# Patient Record
Sex: Male | Born: 1975 | Race: White | Hispanic: No | Marital: Single | Smoking: Never smoker
Health system: Southern US, Community
[De-identification: ages and names within clinical notes are randomized; demographics above are authoritative.]

## PROBLEM LIST (undated history)

## (undated) DIAGNOSIS — E785 Hyperlipidemia, unspecified: Secondary | ICD-10-CM

## (undated) DIAGNOSIS — I10 Essential (primary) hypertension: Secondary | ICD-10-CM

## (undated) DIAGNOSIS — E119 Type 2 diabetes mellitus without complications: Secondary | ICD-10-CM

## (undated) DIAGNOSIS — I5020 Unspecified systolic (congestive) heart failure: Secondary | ICD-10-CM

## (undated) DIAGNOSIS — A4902 Methicillin resistant Staphylococcus aureus infection, unspecified site: Secondary | ICD-10-CM

## (undated) DIAGNOSIS — J45909 Unspecified asthma, uncomplicated: Secondary | ICD-10-CM

## (undated) DIAGNOSIS — I2510 Atherosclerotic heart disease of native coronary artery without angina pectoris: Secondary | ICD-10-CM

## (undated) HISTORY — PX: APPENDECTOMY: SHX54

## (undated) HISTORY — PX: ABSCESS DRAINAGE: SHX1119

---

## 2001-06-25 ENCOUNTER — Encounter: Payer: Self-pay | Admitting: Emergency Medicine

## 2001-06-25 ENCOUNTER — Emergency Department (HOSPITAL_COMMUNITY): Admission: EM | Admit: 2001-06-25 | Discharge: 2001-06-25 | Payer: Self-pay | Admitting: Emergency Medicine

## 2003-02-07 ENCOUNTER — Encounter: Admission: RE | Admit: 2003-02-07 | Discharge: 2003-05-08 | Payer: Self-pay | Admitting: Family Medicine

## 2012-11-17 DIAGNOSIS — A4902 Methicillin resistant Staphylococcus aureus infection, unspecified site: Secondary | ICD-10-CM

## 2012-11-17 HISTORY — DX: Methicillin resistant Staphylococcus aureus infection, unspecified site: A49.02

## 2018-02-02 ENCOUNTER — Other Ambulatory Visit: Payer: Self-pay

## 2018-02-02 ENCOUNTER — Emergency Department (HOSPITAL_COMMUNITY): Payer: No Typology Code available for payment source

## 2018-02-02 ENCOUNTER — Inpatient Hospital Stay (HOSPITAL_COMMUNITY)
Admission: EM | Admit: 2018-02-02 | Discharge: 2018-02-12 | DRG: 286 | Disposition: A | Discharge status: Home / Self Care | Payer: No Typology Code available for payment source | Attending: Cardiology | Admitting: Cardiology

## 2018-02-02 ENCOUNTER — Encounter (HOSPITAL_COMMUNITY): Payer: Self-pay

## 2018-02-02 DIAGNOSIS — E876 Hypokalemia: Secondary | ICD-10-CM | POA: Diagnosis present

## 2018-02-02 DIAGNOSIS — E1165 Type 2 diabetes mellitus with hyperglycemia: Secondary | ICD-10-CM | POA: Diagnosis present

## 2018-02-02 DIAGNOSIS — I5043 Acute on chronic combined systolic (congestive) and diastolic (congestive) heart failure: Secondary | ICD-10-CM

## 2018-02-02 DIAGNOSIS — Z7951 Long term (current) use of inhaled steroids: Secondary | ICD-10-CM | POA: Diagnosis not present

## 2018-02-02 DIAGNOSIS — I10 Essential (primary) hypertension: Secondary | ICD-10-CM | POA: Diagnosis not present

## 2018-02-02 DIAGNOSIS — I509 Heart failure, unspecified: Secondary | ICD-10-CM | POA: Diagnosis not present

## 2018-02-02 DIAGNOSIS — R778 Other specified abnormalities of plasma proteins: Secondary | ICD-10-CM

## 2018-02-02 DIAGNOSIS — E785 Hyperlipidemia, unspecified: Secondary | ICD-10-CM | POA: Diagnosis present

## 2018-02-02 DIAGNOSIS — R791 Abnormal coagulation profile: Secondary | ICD-10-CM | POA: Diagnosis present

## 2018-02-02 DIAGNOSIS — I472 Ventricular tachycardia: Secondary | ICD-10-CM | POA: Diagnosis not present

## 2018-02-02 DIAGNOSIS — G4733 Obstructive sleep apnea (adult) (pediatric): Secondary | ICD-10-CM | POA: Diagnosis present

## 2018-02-02 DIAGNOSIS — I2510 Atherosclerotic heart disease of native coronary artery without angina pectoris: Secondary | ICD-10-CM | POA: Diagnosis present

## 2018-02-02 DIAGNOSIS — R7989 Other specified abnormal findings of blood chemistry: Secondary | ICD-10-CM

## 2018-02-02 DIAGNOSIS — R570 Cardiogenic shock: Secondary | ICD-10-CM | POA: Diagnosis present

## 2018-02-02 DIAGNOSIS — E7800 Pure hypercholesterolemia, unspecified: Secondary | ICD-10-CM | POA: Diagnosis present

## 2018-02-02 DIAGNOSIS — Z8249 Family history of ischemic heart disease and other diseases of the circulatory system: Secondary | ICD-10-CM

## 2018-02-02 DIAGNOSIS — Q231 Congenital insufficiency of aortic valve: Secondary | ICD-10-CM

## 2018-02-02 DIAGNOSIS — I429 Cardiomyopathy, unspecified: Secondary | ICD-10-CM | POA: Diagnosis present

## 2018-02-02 DIAGNOSIS — I493 Ventricular premature depolarization: Secondary | ICD-10-CM | POA: Diagnosis present

## 2018-02-02 DIAGNOSIS — I471 Supraventricular tachycardia: Secondary | ICD-10-CM | POA: Diagnosis present

## 2018-02-02 DIAGNOSIS — Z79899 Other long term (current) drug therapy: Secondary | ICD-10-CM | POA: Diagnosis not present

## 2018-02-02 DIAGNOSIS — E119 Type 2 diabetes mellitus without complications: Secondary | ICD-10-CM

## 2018-02-02 DIAGNOSIS — I110 Hypertensive heart disease with heart failure: Secondary | ICD-10-CM | POA: Diagnosis not present

## 2018-02-02 DIAGNOSIS — I351 Nonrheumatic aortic (valve) insufficiency: Secondary | ICD-10-CM | POA: Diagnosis not present

## 2018-02-02 DIAGNOSIS — I361 Nonrheumatic tricuspid (valve) insufficiency: Secondary | ICD-10-CM | POA: Diagnosis not present

## 2018-02-02 DIAGNOSIS — J45909 Unspecified asthma, uncomplicated: Secondary | ICD-10-CM | POA: Diagnosis present

## 2018-02-02 DIAGNOSIS — R748 Abnormal levels of other serum enzymes: Secondary | ICD-10-CM | POA: Diagnosis present

## 2018-02-02 DIAGNOSIS — I248 Other forms of acute ischemic heart disease: Secondary | ICD-10-CM | POA: Diagnosis present

## 2018-02-02 DIAGNOSIS — Z7984 Long term (current) use of oral hypoglycemic drugs: Secondary | ICD-10-CM | POA: Diagnosis not present

## 2018-02-02 DIAGNOSIS — I5023 Acute on chronic systolic (congestive) heart failure: Secondary | ICD-10-CM | POA: Diagnosis not present

## 2018-02-02 HISTORY — DX: Type 2 diabetes mellitus without complications: E11.9

## 2018-02-02 HISTORY — DX: Hyperlipidemia, unspecified: E78.5

## 2018-02-02 HISTORY — DX: Essential (primary) hypertension: I10

## 2018-02-02 HISTORY — DX: Atherosclerotic heart disease of native coronary artery without angina pectoris: I25.10

## 2018-02-02 HISTORY — DX: Methicillin resistant Staphylococcus aureus infection, unspecified site: A49.02

## 2018-02-02 HISTORY — DX: Unspecified systolic (congestive) heart failure: I50.20

## 2018-02-02 HISTORY — DX: Unspecified asthma, uncomplicated: J45.909

## 2018-02-02 LAB — I-STAT TROPONIN, ED: TROPONIN I, POC: 0.16 ng/mL — AB (ref 0.00–0.08)

## 2018-02-02 LAB — BASIC METABOLIC PANEL
Anion gap: 11 (ref 5–15)
BUN: 31 mg/dL — AB (ref 6–20)
CALCIUM: 9.3 mg/dL (ref 8.9–10.3)
CO2: 21 mmol/L — ABNORMAL LOW (ref 22–32)
CREATININE: 1.12 mg/dL (ref 0.61–1.24)
Chloride: 107 mmol/L (ref 101–111)
GFR calc non Af Amer: >60 mL/min (ref >60)
Glucose, Bld: 110 mg/dL — ABNORMAL HIGH (ref 65–99)
Potassium: 3.5 mmol/L (ref 3.5–5.1)
SODIUM: 139 mmol/L (ref 135–145)

## 2018-02-02 LAB — HEPATIC FUNCTION PANEL
ALT: 16 U/L — AB (ref 17–63)
AST: 33 U/L (ref 15–41)
Albumin: 3.6 g/dL (ref 3.5–5.0)
Alkaline Phosphatase: 52 U/L (ref 38–126)
BILIRUBIN DIRECT: 0.4 mg/dL (ref 0.1–0.5)
BILIRUBIN INDIRECT: 1.1 mg/dL — AB (ref 0.3–0.9)
BILIRUBIN TOTAL: 1.5 mg/dL — AB (ref 0.3–1.2)
Total Protein: 6.7 g/dL (ref 6.5–8.1)

## 2018-02-02 LAB — CBC
HCT: 47.1 % (ref 39.0–52.0)
Hemoglobin: 15.4 g/dL (ref 13.0–17.0)
MCH: 29.1 pg (ref 26.0–34.0)
MCHC: 32.7 g/dL (ref 30.0–36.0)
MCV: 89 fL (ref 78.0–100.0)
PLATELETS: 259 10*3/uL (ref 150–400)
RBC: 5.29 MIL/uL (ref 4.22–5.81)
RDW: 14.8 % (ref 11.5–15.5)
WBC: 14 10*3/uL — AB (ref 4.0–10.5)

## 2018-02-02 LAB — BRAIN NATRIURETIC PEPTIDE: B NATRIURETIC PEPTIDE 5: 1724.6 pg/mL — AB (ref 0.0–100.0)

## 2018-02-02 LAB — RAPID URINE DRUG SCREEN, HOSP PERFORMED
Amphetamines: NOT DETECTED
BARBITURATES: NOT DETECTED
BENZODIAZEPINES: NOT DETECTED
COCAINE: NOT DETECTED
Opiates: NOT DETECTED
TETRAHYDROCANNABINOL: NOT DETECTED

## 2018-02-02 LAB — GLUCOSE, CAPILLARY: Glucose-Capillary: 155 mg/dL — ABNORMAL HIGH (ref 65–99)

## 2018-02-02 LAB — TSH: TSH: 3.207 u[IU]/mL (ref 0.350–4.500)

## 2018-02-02 LAB — D-DIMER, QUANTITATIVE: D-Dimer, Quant: 1.54 ug/mL-FEU — ABNORMAL HIGH (ref 0.00–0.50)

## 2018-02-02 LAB — TROPONIN I: Troponin I: 0.2 ng/mL (ref <0.03)

## 2018-02-02 MED ORDER — FUROSEMIDE 10 MG/ML IJ SOLN
20.0000 mg | Freq: Once | INTRAMUSCULAR | Status: AC
  Administered 2018-02-02: 20 mg via INTRAVENOUS
  Filled 2018-02-02: qty 2

## 2018-02-02 MED ORDER — SODIUM CHLORIDE 0.9% FLUSH
3.0000 mL | Freq: Two times a day (BID) | INTRAVENOUS | Status: DC
  Administered 2018-02-02 – 2018-02-11 (×10): 3 mL via INTRAVENOUS

## 2018-02-02 MED ORDER — ALBUTEROL SULFATE (2.5 MG/3ML) 0.083% IN NEBU: 3.0000 mL | INHALATION_SOLUTION | Freq: Four times a day (QID) | RESPIRATORY_TRACT | Status: DC | PRN

## 2018-02-02 MED ORDER — ATORVASTATIN CALCIUM 40 MG PO TABS
40.0000 mg | ORAL_TABLET | Freq: Every day | ORAL | Status: DC
  Administered 2018-02-02 – 2018-02-08 (×7): 40 mg via ORAL
  Filled 2018-02-02 (×8): qty 1

## 2018-02-02 MED ORDER — OMEGA-3-ACID ETHYL ESTERS 1 G PO CAPS
1.0000 g | ORAL_CAPSULE | Freq: Two times a day (BID) | ORAL | Status: DC
  Administered 2018-02-02 – 2018-02-12 (×19): 1 g via ORAL
  Filled 2018-02-02 (×19): qty 1

## 2018-02-02 MED ORDER — IOPAMIDOL (ISOVUE-370) INJECTION 76%
INTRAVENOUS | Status: AC
  Administered 2018-02-02: 100 mL
  Filled 2018-02-02: qty 100

## 2018-02-02 MED ORDER — ACETAMINOPHEN 325 MG PO TABS: 650.0000 mg | ORAL_TABLET | ORAL | Status: DC | PRN

## 2018-02-02 MED ORDER — SERTRALINE HCL 100 MG PO TABS
100.0000 mg | ORAL_TABLET | Freq: Every day | ORAL | Status: DC
  Administered 2018-02-03 – 2018-02-12 (×10): 100 mg via ORAL
  Filled 2018-02-02 (×10): qty 1

## 2018-02-02 MED ORDER — ONDANSETRON HCL 4 MG/2ML IJ SOLN: 4.0000 mg | Freq: Four times a day (QID) | INTRAMUSCULAR | Status: DC | PRN

## 2018-02-02 MED ORDER — INSULIN ASPART 100 UNIT/ML ~~LOC~~ SOLN: 0.0000 [IU] | Freq: Every day | SUBCUTANEOUS | Status: DC

## 2018-02-02 MED ORDER — SODIUM CHLORIDE 0.9 % IV SOLN: 250.0000 mL | INTRAVENOUS | Status: DC | PRN

## 2018-02-02 MED ORDER — ASPIRIN EC 81 MG PO TBEC: 81.0000 mg | DELAYED_RELEASE_TABLET | Freq: Every day | ORAL | Status: DC

## 2018-02-02 MED ORDER — FLUTICASONE PROPIONATE 50 MCG/ACT NA SUSP
1.0000 | Freq: Every day | NASAL | Status: DC
  Administered 2018-02-05 – 2018-02-12 (×8): 1 via NASAL
  Filled 2018-02-02 (×2): qty 16

## 2018-02-02 MED ORDER — MOMETASONE FURO-FORMOTEROL FUM 200-5 MCG/ACT IN AERO
2.0000 | INHALATION_SPRAY | Freq: Two times a day (BID) | RESPIRATORY_TRACT | Status: DC
  Administered 2018-02-03 – 2018-02-12 (×17): 2 via RESPIRATORY_TRACT
  Filled 2018-02-02: qty 8.8

## 2018-02-02 MED ORDER — SODIUM CHLORIDE 0.9% FLUSH: 3.0000 mL | INTRAVENOUS | Status: DC | PRN

## 2018-02-02 MED ORDER — LOSARTAN POTASSIUM 50 MG PO TABS
100.0000 mg | ORAL_TABLET | Freq: Every day | ORAL | Status: DC
  Administered 2018-02-03: 100 mg via ORAL
  Filled 2018-02-02: qty 2

## 2018-02-02 MED ORDER — CARVEDILOL 3.125 MG PO TABS
3.1250 mg | ORAL_TABLET | Freq: Two times a day (BID) | ORAL | Status: DC
  Administered 2018-02-03 – 2018-02-04 (×3): 3.125 mg via ORAL
  Filled 2018-02-02 (×4): qty 1

## 2018-02-02 MED ORDER — ASPIRIN 81 MG PO CHEW
81.0000 mg | CHEWABLE_TABLET | Freq: Every day | ORAL | Status: DC
  Administered 2018-02-02 – 2018-02-03 (×2): 81 mg via ORAL
  Filled 2018-02-02 (×3): qty 1

## 2018-02-02 MED ORDER — FUROSEMIDE 10 MG/ML IJ SOLN
40.0000 mg | Freq: Two times a day (BID) | INTRAMUSCULAR | Status: DC
  Administered 2018-02-02 – 2018-02-04 (×4): 40 mg via INTRAVENOUS
  Filled 2018-02-02 (×3): qty 4

## 2018-02-02 MED ORDER — INSULIN ASPART 100 UNIT/ML ~~LOC~~ SOLN
0.0000 [IU] | Freq: Three times a day (TID) | SUBCUTANEOUS | Status: DC
  Administered 2018-02-03: 3 [IU] via SUBCUTANEOUS
  Administered 2018-02-04: 5 [IU] via SUBCUTANEOUS
  Administered 2018-02-05: 2 [IU] via SUBCUTANEOUS

## 2018-02-02 MED ORDER — ENOXAPARIN SODIUM 40 MG/0.4ML ~~LOC~~ SOLN
40.0000 mg | SUBCUTANEOUS | Status: DC
  Administered 2018-02-02 – 2018-02-04 (×3): 40 mg via SUBCUTANEOUS
  Filled 2018-02-02 (×3): qty 0.4

## 2018-02-02 NOTE — Plan of Care (Signed)
  Activity: Risk for activity intolerance will decrease 02/02/2018 2242 - Completed/Met by Theora Gianotti, RN  Pt up ad lib. Ambulates independently with steady gait.

## 2018-02-02 NOTE — ED Provider Notes (Signed)
[facility] EMERGENCY DEPARTMENT Provider Note   CSN: [id number] Arrival date & time: 02/02/18  1225     History   Chief Complaint Chief Complaint  Patient presents with  . Shortness of Breath  . Leg Swelling    HPI Curtis [name] is a 42 y.o. male.  Patient c/o increased bil leg swelling, and sob in the past 2 weeks. Symptoms gradual onset, persistent, moderate,  slowly worsening. Denies hx same. States compliant w his normal meds, no recent change in meds or doses. Is urinating normal amount. +orthopnea. Denies current or recent chest pain or discomfort. No recent febrile illness or uri symptoms. No hx chf. No hx renal or liver disease. 15 lb weight increase in past few weeks.    The history is provided by the patient.  Shortness of Breath  Associated symptoms include leg swelling. Pertinent negatives include no fever, no headaches, no sore throat, no neck pain, no chest pain, no vomiting, no abdominal pain and no rash.    Past Medical History:  Diagnosis Date  . Diabetes mellitus without complication (HCC)   . Hypertension     There are no active problems to display for this patient.   Past Surgical History:  Procedure Laterality Date  . APPENDECTOMY         Home Medications    Prior to Admission medications   Not on File    Family History No family history on file.  Social History Social History   Tobacco Use  . Smoking status: Never Smoker  . Smokeless tobacco: Never Used  Substance Use Topics  . Alcohol use: No    Frequency: Never  . Drug use: No     Allergies   Patient has no known allergies.   Review of Systems Review of Systems  Constitutional: Negative for fever.  HENT: Negative for sore throat.   Eyes: Negative for redness.  Respiratory: Positive for shortness of breath.   Cardiovascular: Positive for leg swelling. Negative for chest pain.  Gastrointestinal: Negative for abdominal pain and vomiting.    Genitourinary: Negative for flank pain.  Musculoskeletal: Negative for neck pain.  Skin: Negative for rash.  Neurological: Negative for headaches.  Hematological: Does not bruise/bleed easily.  Psychiatric/Behavioral: Negative for confusion.     Physical Exam Updated Vital Signs BP (!) 128/94 (BP Location: Right Arm)   Pulse (!) 112   Temp 97.8 F (36.6 C) (Oral)   Resp 16   SpO2 98%   Physical Exam  Constitutional: He appears well-developed and well-nourished. No distress.  HENT:  Mouth/Throat: Oropharynx is clear and moist.  Eyes: Conjunctivae are normal.  Neck: Neck supple. No tracheal deviation present. No thyromegaly present.  Cardiovascular: Regular rhythm, normal heart sounds and intact distal pulses.  Tachycardic.   Pulmonary/Chest: Effort normal. No accessory muscle usage. No respiratory distress.  Rales bases  Abdominal: Soft. Bowel sounds are normal. He exhibits no distension. There is no tenderness.  Genitourinary:  Genitourinary Comments: No cva tenderness  Musculoskeletal: He exhibits edema.  Edema to thighs bil, symmetric.   Neurological: He is alert.  Skin: Skin is warm and dry. He is not diaphoretic.  Psychiatric: He has a normal mood and affect.  Nursing note and vitals reviewed.    ED Treatments / Results  Labs (all labs ordered are listed, but only abnormal results are displayed) Results for orders placed or performed during the hospital encounter of 02/02/18  Basic metabolic panel  Result Value Ref Range  Sodium 139 135 - 145 mmol/L   Potassium 3.5 3.5 - 5.1 mmol/L   Chloride 107 101 - 111 mmol/L   CO2 21 (L) 22 - 32 mmol/L   Glucose, Bld 110 (H) 65 - 99 mg/dL   BUN 31 (H) 6 - 20 mg/dL   Creatinine, Ser 5.45 0.61 - 1.24 mg/dL   Calcium 9.3 8.9 - 62.5 mg/dL   GFR calc non Af Amer >60 >60 mL/min   GFR calc Af Amer >60 >60 mL/min   Anion gap 11 5 - 15  CBC  Result Value Ref Range   WBC 14.0 (H) 4.0 - 10.5 K/uL   RBC 5.29 4.22 - 5.81  MIL/uL   Hemoglobin 15.4 13.0 - 17.0 g/dL   HCT 63.8 93.7 - 34.2 %   MCV 89.0 78.0 - 100.0 fL   MCH 29.1 26.0 - 34.0 pg   MCHC 32.7 30.0 - 36.0 g/dL   RDW 87.6 81.1 - 57.2 %   Platelets 259 150 - 400 K/uL  Brain natriuretic peptide  Result Value Ref Range   B Natriuretic Peptide 1,724.6 (H) 0.0 - 100.0 pg/mL  Hepatic function panel  Result Value Ref Range   Total Protein 6.7 6.5 - 8.1 g/dL   Albumin 3.6 3.5 - 5.0 g/dL   AST 33 15 - 41 U/L   ALT 16 (L) 17 - 63 U/L   Alkaline Phosphatase 52 38 - 126 U/L   Total Bilirubin 1.5 (H) 0.3 - 1.2 mg/dL   Bilirubin, Direct 0.4 0.1 - 0.5 mg/dL   Indirect Bilirubin 1.1 (H) 0.3 - 0.9 mg/dL  D-dimer, quantitative (not at [facility])  Result Value Ref Range   D-Dimer, Quant 1.54 (H) 0.00 - 0.50 ug/mL-FEU  I-stat troponin, ED  Result Value Ref Range   Troponin i, poc 0.16 (HH) 0.00 - 0.08 ng/mL   Comment NOTIFIED PHYSICIAN    Comment 3           Dg Chest 2 View  Result Date: 02/02/2018 CLINICAL DATA:  Shortness of breath, lower extremity and abdominal swelling, and 15 pound weight gain over the past 3 days. History of diabetes and asthma but no history of CHF. EXAM: CHEST - 2 VIEW COMPARISON:  None in PACs FINDINGS: The lungs are adequately inflated. There are bilateral pleural effusions greatest on the right. The lung markings are coarse in the left retrocardiac region. The cardiac silhouette is enlarged. The central pulmonary vascularity is engorged and there is mild cephalization. The mediastinum is normal in width. There is multilevel degenerative disc disease of the thoracic spine. IMPRESSION: CHF with mild interstitial edema and bilateral pleural effusions. Electronically Signed   By: David  [place] M.D.   On: 02/02/2018 13:07    EKG  EKG Interpretation  Date/Time:  Tuesday February 02 2018 12:40:30 EDT Ventricular Rate:  114 PR Interval:  168 QRS Duration: 118 QT Interval:  356 QTC Calculation: 490 R Axis:   66 Text Interpretation:  Sinus  rhythm Premature ventricular complexes Left ventricular hypertrophy Non-specific ST-t changes No previous tracing Confirmed by Cathren Laine ([id number]) on 02/02/2018 1:26:04 PM       Radiology Dg Chest 2 View  Result Date: 02/02/2018 CLINICAL DATA:  Shortness of breath, lower extremity and abdominal swelling, and 15 pound weight gain over the past 3 days. History of diabetes and asthma but no history of CHF. EXAM: CHEST - 2 VIEW COMPARISON:  None in PACs FINDINGS: The lungs are adequately inflated. There are bilateral pleural effusions  greatest on the right. The lung markings are coarse in the left retrocardiac region. The cardiac silhouette is enlarged. The central pulmonary vascularity is engorged and there is mild cephalization. The mediastinum is normal in width. There is multilevel degenerative disc disease of the thoracic spine. IMPRESSION: CHF with mild interstitial edema and bilateral pleural effusions. Electronically Signed   By: David  [place] M.D.   On: 02/02/2018 13:07    Procedures Procedures (including critical care time)  Medications Ordered in ED Medications - No data to display   Initial Impression / Assessment and Plan / ED Course  I have reviewed the triage vital signs and the nursing notes.  Pertinent labs & imaging results that were available during my care of the patient were reviewed by me and considered in my medical decision making (see chart for details).  Iv ns. Labs. Cxr. Ecg.  Reviewed nursing notes and prior charts for additional history.   Labs reviewed, troponin is high.   CTa pending.  Feel patient will require echo and additional workup.  Hospitalists consulted for admission.     Final Clinical Impressions(s) / ED Diagnoses   Final diagnoses:  None    ED Discharge Orders    None       Cathren Laine, MD 02/02/18 1445

## 2018-02-02 NOTE — ED Triage Notes (Signed)
Pt reports he was sent from pcp d/t 15lb weight gain over last 3 days with sob, abdominal swelling, BLE swelling. Denies CP. Pt denies hx of chf.

## 2018-02-02 NOTE — Consult Note (Signed)
CONSULTATION NOTE   Patient Name: Curtis [name] Date of Encounter: 02/02/2018 Cardiologist: NEW patient  Chief Complaint   Shortness of breath  Patient Profile   42 yo male with DM2, HTN, dyslipidemia, presents with SOB, LE edema, orthopnea and weight gain.   HPI   Curtis [name] is a 42 y.o. male who is being seen today for the evaluation of new onset CHF at the request of Dr. Lorin Mercy. This is a 42 yo male patient with a history of DM2, HTN, dyslipidemia and family history of CAD - parents are patients of Dr. Martinique. He reports running out of his hypertension meds in February and has had worsening edema, DOE, orthopnea and 15 lb weight gain over the past several weeks, eventually, causing him to present to the ER. Labs indicate a significantly elevated BNP of 1724 with mildly elevated troponin of 0.16. D-dimer was positive at 1.54 - a CT angiogram was performed which was negative for pulmonary embolus, but did show cardiomegaly with coronary artery calcification (primarily in the LAD), pericardial effusion and bilateral pleural effusions. EKG (personally reviewed) showed sinus tachycardia, PVC's and IVCD with LVH by voltage. He denies anginal symptoms.  PMHx   Past Medical History:  Diagnosis Date  . Asthma   . Diabetes mellitus without complication ([place])   . Hyperlipidemia   . Hypertension     Past Surgical History:  Procedure Laterality Date  . ABSCESS DRAINAGE     MRSA inside nares  . APPENDECTOMY      FAMHx   Family History  Problem Relation Age of Onset  . Mitral valve prolapse Mother   . Supraventricular tachycardia Mother   . CAD Father 45       CABG, stent  . Hypertension Father   . Heart failure Maternal Grandfather   . Congestive Heart Failure Paternal Uncle        has pacer, defibrillator  . CAD Paternal Grandfather   . CAD Maternal Grandmother   . CAD Maternal Uncle     SOCHx    reports that  has never smoked. he has never used smokeless tobacco.  He reports that he does not drink alcohol or use drugs.  Outpatient Medications   No current facility-administered medications on file prior to encounter.    Current Outpatient Medications on File Prior to Encounter  Medication Sig Dispense Refill  . albuterol (PROVENTIL HFA;VENTOLIN HFA) 108 (90 Base) MCG/ACT inhaler Inhale 2 puffs into the lungs every 6 (six) hours as needed for wheezing or shortness of breath.    . budesonide-formoterol (SYMBICORT) 160-4.5 MCG/ACT inhaler Inhale 2 puffs into the lungs 2 (two) times daily.    . Cholecalciferol (VITAMIN D3) 5000 units CAPS Take 5,000 Units by mouth daily.    . fluticasone (FLONASE) 50 MCG/ACT nasal spray Place 1 spray into both nostrils daily.    Marland Kitchen glimepiride (AMARYL) 2 MG tablet Take 2 mg by mouth daily with breakfast.    . hydrochlorothiazide (HYDRODIURIL) 25 MG tablet Take 25 mg by mouth daily.    Marland Kitchen losartan (COZAAR) 100 MG tablet Take 100 mg by mouth daily.    Marland Kitchen METFORMIN HCL ER PO Take 1,000 mg by mouth 2 (two) times daily.     . Multiple Vitamin (MULTIVITAMIN) tablet Take 1 tablet by mouth daily.    Marland Kitchen omega-3 acid ethyl esters (LOVAZA) 1 g capsule Take 1 g by mouth 2 (two) times daily.    . sertraline (ZOLOFT) 100 MG tablet Take 100 mg by  mouth daily.    . simvastatin (ZOCOR) 20 MG tablet Take 20 mg by mouth daily at 6 PM.      Inpatient Medications    Scheduled Meds: . atorvastatin  40 mg Oral q1800  . furosemide  20 mg Intravenous Once    Continuous Infusions:   PRN Meds:    ALLERGIES   No Known Allergies  ROS   Pertinent items noted in HPI and remainder of comprehensive ROS otherwise negative.  Vitals   Vitals:   02/02/18 1630 02/02/18 1700 02/02/18 1715 02/02/18 1745  BP: (!) 134/101 (!) 127/102 (!) 135/100 140/90  Pulse: (!) 108 (!) 51 (!) 106 (!) 107  Resp: (!) 27 (!) 31 (!) 33 (!) 24  Temp:      TempSrc:      SpO2: 100% 98% 100% 99%   No intake or output data in the 24 hours ending 02/02/18  1800 There were no vitals filed for this visit.  Physical Exam   General appearance: alert, no distress and tall, multiple tattoos Neck: JVD - several cm above sternal notch, no carotid bruit and thyroid not enlarged, symmetric, no tenderness/mass/nodules Lungs: diminished breath sounds bilaterally and dullness to percussion RLL and RML Heart: regular tachycardia, occasional missed beats Abdomen: protuberant, soft, non-tender Extremities: edema 2+ bilateral LE pitting edema Pulses: 2+ and symmetric Skin: Skin color, texture, turgor normal. No rashes or lesions Neurologic: Grossly normal Psych: Pleasant, mildly anxious  Labs   Results for orders placed or performed during the hospital encounter of 02/02/18 (from the past 48 hour(s))  Basic metabolic panel     Status: Abnormal   Collection Time: 02/02/18 12:47 PM  Result Value Ref Range   Sodium 139 135 - 145 mmol/L   Potassium 3.5 3.5 - 5.1 mmol/L   Chloride 107 101 - 111 mmol/L   CO2 21 (L) 22 - 32 mmol/L   Glucose, Bld 110 (H) 65 - 99 mg/dL   BUN 31 (H) 6 - 20 mg/dL   Creatinine, Ser 1.12 0.61 - 1.24 mg/dL   Calcium 9.3 8.9 - 10.3 mg/dL   GFR calc non Af Amer >60 >60 mL/min   GFR calc Af Amer >60 >60 mL/min    Comment: (NOTE) The eGFR has been calculated using the CKD EPI equation. This calculation has not been validated in all clinical situations. eGFR's persistently <60 mL/min signify possible Chronic Kidney Disease.    Anion gap 11 5 - 15    Comment: Performed at [facility] [address] [place], [place] [address]  CBC     Status: Abnormal   Collection Time: 02/02/18 12:47 PM  Result Value Ref Range   WBC 14.0 (H) 4.0 - 10.5 K/uL   RBC 5.29 4.22 - 5.81 MIL/uL   Hemoglobin 15.4 13.0 - 17.0 g/dL   HCT 47.1 39.0 - 52.0 %   MCV 89.0 78.0 - 100.0 fL   MCH 29.1 26.0 - 34.0 pg   MCHC 32.7 30.0 - 36.0 g/dL   RDW 14.8 11.5 - 15.5 %   Platelets 259 150 - 400 K/uL    Comment: Performed at [facility], [place] [address] [place], [place] [address]  I-stat troponin, ED     Status: Abnormal   Collection Time: 02/02/18  1:06 PM  Result Value Ref Range   Troponin i, poc 0.16 (HH) 0.00 - 0.08 ng/mL   Comment NOTIFIED PHYSICIAN    Comment 3  Comment: Due to the release kinetics of cTnI, a negative result within the first hours of the onset of symptoms does not rule out myocardial infarction with certainty. If myocardial infarction is still suspected, repeat the test at appropriate intervals.   Brain natriuretic peptide     Status: Abnormal   Collection Time: 02/02/18  1:29 PM  Result Value Ref Range   B Natriuretic Peptide 1,724.6 (H) 0.0 - 100.0 pg/mL    Comment: Performed at [facility] [address], [place], [place] [address]  Hepatic function panel     Status: Abnormal   Collection Time: 02/02/18  1:39 PM  Result Value Ref Range   Total Protein 6.7 6.5 - 8.1 g/dL   Albumin 3.6 3.5 - 5.0 g/dL   AST 33 15 - 41 U/L   ALT 16 (L) 17 - 63 U/L   Alkaline Phosphatase 52 38 - 126 U/L   Total Bilirubin 1.5 (H) 0.3 - 1.2 mg/dL   Bilirubin, Direct 0.4 0.1 - 0.5 mg/dL   Indirect Bilirubin 1.1 (H) 0.3 - 0.9 mg/dL    Comment: Performed at [facility] [address] [place], [place] [address]  D-dimer, quantitative (not at [facility])     Status: Abnormal   Collection Time: 02/02/18  1:39 PM  Result Value Ref Range   D-Dimer, Quant 1.54 (H) 0.00 - 0.50 ug/mL-FEU    Comment: (NOTE) At the manufacturer cut-off of 0.50 ug/mL FEU, this assay has been documented to exclude PE with a sensitivity and negative predictive value of 97 to 99%.  At this time, this assay has not been approved by the FDA to exclude DVT/VTE. Results should be correlated with clinical presentation. Performed at [facility], [place] [address] [place], [place] [address]   TSH     Status: None   Collection Time: 02/02/18  1:42 PM  Result Value Ref Range   TSH 3.207 0.350 - 4.500 uIU/mL     Comment: Performed by a 3rd Generation assay with a functional sensitivity of <=0.01 uIU/mL. Performed at [facility], [place] [address], [place], [place] [address]     ECG   Sinus tachycardia with PVC's, LVH, IVCD - Personally Reviewed  Telemetry   Sinus tachy with PVC's - Personally Reviewed  Radiology   Dg Chest 2 View  Result Date: 02/02/2018 CLINICAL DATA:  Shortness of breath, lower extremity and abdominal swelling, and 15 pound weight gain over the past 3 days. History of diabetes and asthma but no history of CHF. EXAM: CHEST - 2 VIEW COMPARISON:  None in PACs FINDINGS: The lungs are adequately inflated. There are bilateral pleural effusions greatest on the right. The lung markings are coarse in the left retrocardiac region. The cardiac silhouette is enlarged. The central pulmonary vascularity is engorged and there is mild cephalization. The mediastinum is normal in width. There is multilevel degenerative disc disease of the thoracic spine. IMPRESSION: CHF with mild interstitial edema and bilateral pleural effusions. Electronically Signed   By: David  Martinique M.D.   On: 02/02/2018 13:07   Ct Angio Chest Pe W And/or Wo Contrast  Result Date: 02/02/2018 CLINICAL DATA:  Short of breath, lower extremity swelling for 5 days, positive D-dimer EXAM: CT ANGIOGRAPHY CHEST WITH CONTRAST TECHNIQUE: Multidetector CT imaging of the chest was performed using the standard protocol during bolus administration of intravenous contrast. Multiplanar CT image reconstructions and MIPs were obtained to evaluate the vascular anatomy. CONTRAST:  110m ISOVUE-370 IOPAMIDOL (  ISOVUE-370) INJECTION 76% COMPARISON:  Chest x-ray of 02/02/2018 FINDINGS: Cardiovascular: There is moderate cardiomegaly present there may be a small pericardial effusion present. There is good opacification of the pulmonary arteries. There is no evidence of acute pulmonary embolism. Diffuse coronary artery calcifications are noted  primarily in the left anterior descending distribution. The mid ascending thoracic aorta measures 39 mm in diameter. Mediastinum/Nodes: No mediastinal or hilar adenopathy is noted. The thyroid gland is unremarkable although not well visualized. Esophagus is unremarkable. Lungs/Pleura: On lung window images, there are bilateral pleural effusions right larger than left with resultant bibasilar atelectasis posteriorly. Pneumonia would be difficult to exclude particularly involving the right lower lobe although this appearance most likely reflects atelectasis. No suspicious lung nodule is seen. Upper Abdomen: Images through the upper abdomen show no significant abnormality. There is some reflux of contrast into the hepatic veins. Musculoskeletal: The thoracic vertebrae are in normal alignment with moderate degenerative spurring throughout the thoracic spine. No compression deformity is seen. Review of the MIP images confirms the above findings. IMPRESSION: 1. No evidence of acute pulmonary embolism. 2. Cardiomegaly, bilateral pleural effusions, right greater than left, findings most consistent with CHF. 3. Small pericardial effusion. 4. Coronary artery calcifications primarily within the left anterior descending artery. Electronically Signed   By: Ivar Drape M.D.   On: 02/02/2018 15:45    Cardiac Studies   Echo pending  Impression   1. Principal Problem: 2.   CHF exacerbation ([place]) 3. Active Problems: 4.   Asthma in adult, unspecified asthma severity, uncomplicated 5.   Essential hypertension 6.   Diabetes mellitus type 2, controlled ([place]) 7.   Hyperlipidemia 8.   Recommendation   1. Mr. Mclaren has acute CHF which I suspect may be ischemic. Initial troponin is elevated, would trend overnight. He denies chest pain. He has multiple cardiac risk factors and a strong family history of CAD. Plan for echo in the am tomorrow - if there is significant systolic dysfunction, will likely recommend a L/RHC to  further evaluate, especially given coronary artery calcification. For now, agree with diuresis.  Thanks for the consulting [place]. Will follow closely with you.  Time Spent Directly with Patient:  I have spent a total of 45 minutes with the patient reviewing hospital notes, telemetry, EKGs, labs and examining the patient as well as establishing an assessment and plan that was discussed personally with the patient. > 50% of time was spent in direct patient care.  Length of Stay:  LOS: 0 days   Pixie Casino, MD, [facility], [place] Director of the Advanced Lipid Disorders &  Cardiovascular Risk Reduction Clinic Diplomate of the American Board of Clinical Lipidology Attending Cardiologist  Direct Dial: [phone]  Fax: [phone]  Website:  [email or web address] 02/02/2018, 6:00 PM

## 2018-02-02 NOTE — ED Notes (Signed)
Heart healthy dinner tray ordered 

## 2018-02-02 NOTE — ED Notes (Signed)
Cardiology at bedside for evaluation.

## 2018-02-02 NOTE — ED Notes (Signed)
Gave pt [place] sandwich, applesauce and sprite, per Ashley-RN.

## 2018-02-02 NOTE — ED Notes (Signed)
Pt ambulated with steady gait to the bathroom.

## 2018-02-02 NOTE — H&P (Signed)
History and Physical    Curtis [name] RUE:[id number] DOB: August 17, 1976 DOA: 02/02/2018  PCP:   Gweneth Dimitri  Consultants:  None Patient coming from: Home - lives alone; NOK: parents, [phone], [phone], Brett Canales and Aggie  Chief Complaint: SOB, leg swelling  HPI: Curtis [name] is a 42 y.o. male with medical history significant of DM and HTN presenting with edema and SOB.  He had been taking medications for BP.  He ran out (Losartan/HCTZ) at the end of February.  He thought he felt bad because of that.  .  Last Friday, he developed LE edema.  It became really uncomfortable.  By Friday evening, he was having trouble walking.  He stayed out of work Saturday and spent the weekend with his parents.  He called his PCP office yesterday and they scheduled him to be seen today.  She sent him here to the ER.  He had been having some breathing issues since maybe mid-February.  His PCP added a controller medication and the "asthma issues" - SOB and cough, especially in the mornings - improved other than "just a bit of hoarseness" since. He started with SOB over the weekend, no CP.  It would knock him down, take him a second to regain his breath.  SOB was with exertion.  He usually uses 2 pillows but has needed at least 3 to prop him self up, + orthopnea.  +PND since Saturday.  He denies issues like viral illness.  He has gained about 15 pounds in the last 1-2 weeks.  His only illicit substances were weed edibles, in Dec or November.  He has multiple tattoos, all obtained from clean facilties.  He has been tested for hepatitis and was negative in the past, had hepatits vaccines 2 years ago.  He has not had a (male) sexual partner in about 2 years.    ED Course:   Sent from PCP for 1-2 weeks orthopnea, LE edema, and DOE.  No h/o CHF.  Has vascular congestion with edema on CXR.  Elevated BNP and troponin, no chest pain.  CHF exacerbation, needs echo and diuresis.  Review of Systems: As per HPI; otherwise  review of systems reviewed and negative.   Ambulatory Status:   Ambulates without assistance  Past Medical History:  Diagnosis Date  . Asthma   . Diabetes mellitus without complication (HCC)   . Hyperlipidemia   . Hypertension     Past Surgical History:  Procedure Laterality Date  . ABSCESS DRAINAGE     MRSA inside nares  . APPENDECTOMY      Social History   Socioeconomic History  . Marital status: Single    Spouse name: Not on file  . Number of children: Not on file  . Years of education: Not on file  . Highest education level: Not on file  Social Needs  . Financial resource strain: Not on file  . Food insecurity - worry: Not on file  . Food insecurity - inability: Not on file  . Transportation needs - medical: Not on file  . Transportation needs - non-medical: Not on file  Occupational History  . Occupation: Curator  Tobacco Use  . Smoking status: Never Smoker  . Smokeless tobacco: Never Used  Substance and Sexual Activity  . Alcohol use: No    Frequency: Never    Comment: remote occasional use  . 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 Sexual activity: No  Partners: Female    Comment: last 2 years ago  Other Topics Concern  . Not on file  Social History Narrative  . Not on file    No Known Allergies  Family History  Problem Relation Age of Onset  . Mitral valve prolapse Mother   . Supraventricular tachycardia Mother   . CAD Father 85       CABG, stent  . Hypertension Father   . Heart failure Maternal Grandfather   . Congestive Heart Failure Paternal Uncle        has pacer, defibrillator  . CAD Paternal Grandfather   . CAD Maternal Grandmother   . CAD Maternal Uncle     Prior to Admission medications   Not on File    Physical Exam: Vitals:   02/02/18 1400 02/02/18 1415 02/02/18 1430 02/02/18 1445  BP: (!) 133/98 (!) 130/91 (!) 124/107 (!) 129/93  Pulse: (!) 107 (!) 104 (!) 111 (!) 105    Resp: 19 (!) 22 (!) 24 (!) 22  Temp:      TempSrc:      SpO2: 100% 98% 100% 97%     General:  Appears calm and comfortable and is NAD Eyes:  PERRL, EOMI, normal lids, iris ENT:  grossly normal hearing, lips & tongue, mmm; appropriate dentition Neck:  no LAD, masses or thyromegaly; no carotid bruits Cardiovascular:  Tachycardia, no m/r/g. No JVD. Respiratory:  Left basilar crackles, poor air movement in the RLL.  Normal respiratory effort. Abdomen:  soft, NT, ND, NABS Back:   normal alignment, no CVAT Skin:  no rash or induration seen on limited exam, many tattoos Musculoskeletal:  grossly normal tone BUE/BLE, good ROM, no bony abnormality Lower extremity: Marked LE edema, 4+, pitting, extending above his knees.  Limited foot exam with no ulcerations.  2+ distal pulses. Psychiatric:  grossly normal mood and affect, speech fluent and appropriate, AOx3 Neurologic:  CN 2-12 grossly intact, moves all extremities in coordinated fashion, sensation intact    Radiological Exams on Admission: Dg Chest 2 View  Result Date: 02/02/2018 CLINICAL DATA:  Shortness of breath, lower extremity and abdominal swelling, and 15 pound weight gain over the past 3 days. History of diabetes and asthma but no history of CHF. EXAM: CHEST - 2 VIEW COMPARISON:  None in PACs FINDINGS: The lungs are adequately inflated. There are bilateral pleural effusions greatest on the right. The lung markings are coarse in the left retrocardiac region. The cardiac silhouette is enlarged. The central pulmonary vascularity is engorged and there is mild cephalization. The mediastinum is normal in width. There is multilevel degenerative disc disease of the thoracic spine. IMPRESSION: CHF with mild interstitial edema and bilateral pleural effusions. Electronically Signed   By: David  [place]iland M.D.   On: 02/02/2018 13:07   Ct Angio Chest Pe W And/or Wo Contrast  Result Date: 02/02/2018 CLINICAL DATA:  Short of breath, lower extremity  swelling for 5 days, positive D-dimer EXAM: CT ANGIOGRAPHY CHEST WITH CONTRAST TECHNIQUE: Multidetector CT imaging of the chest was performed using the standard protocol during bolus administration of intravenous contrast. Multiplanar CT image reconstructions and MIPs were obtained to evaluate the vascular anatomy. CONTRAST:  ISOVUE-370 IOPAMIDOL (ISOVUE-370) INJECTION 76% COMPARISON:  Chest x-ray of 02/02/2018 FINDINGS: Cardiovascular: There is moderate cardiomegaly present there may be a small pericardial effusion present. There is good opacification of the pulmonary arteries. There is no evidence of acute pulmonary embolism. Diffuse coronary artery calcifications are noted primarily in the left anterior  descending distribution. The mid ascending thoracic aorta measures 39 mm in diameter. Mediastinum/Nodes: No mediastinal or hilar adenopathy is noted. The thyroid gland is unremarkable although not well visualized. Esophagus is unremarkable. Lungs/Pleura: On lung window images, there are bilateral pleural effusions right larger than left with resultant bibasilar atelectasis posteriorly. Pneumonia would be difficult to exclude particularly involving the right lower lobe although this appearance most likely reflects atelectasis. No suspicious lung nodule is seen. Upper Abdomen: Images through the upper abdomen show no significant abnormality. There is some reflux of contrast into the hepatic veins. Musculoskeletal: The thoracic vertebrae are in normal alignment with moderate degenerative spurring throughout the thoracic spine. No compression deformity is seen. Review of the MIP images confirms the above findings. IMPRESSION: 1. No evidence of acute pulmonary embolism. 2. Cardiomegaly, bilateral pleural effusions, right greater than left, findings most consistent with CHF. 3. Small pericardial effusion. 4. Coronary artery calcifications primarily within the left anterior descending artery. Electronically Signed    By: Dwyane Dee M.D.   On: 02/02/2018 15:45    EKG: Independently reviewed.  Sinus tachycardia with rate 114; PVCs and LVH with nonspecific ST changes with no evidence of acute ischemia   Labs on Admission: I have personally reviewed the available labs and imaging studies at the time of the admission.  Pertinent labs:   Glucose 110 BUN 31/Creatinine 1.12/GFR >60 Bili 1.5 BNP 1724.6 Troponin 0.16 D-dimer 1.54 TSH 3.207 WBC 14.0   Assessment/Plan Principal Problem:   CHF exacerbation (HCC) Active Problems:   Asthma in adult, unspecified asthma severity, uncomplicated   Essential hypertension   Diabetes mellitus type 2, controlled (HCC)   Hyperlipidemia   CHF exacerbation -Patient without smoking history or prior h/o respiratory failure presenting with worsening SOB and marked edema with orthopnea, PND, and 15 pound weight gain in 1-2 weeks -CXR consistent with pulmonary edema -Significantly elevated BNP -With elevated BNP and abnl CXR, new-onset CHF seems most probable as diagnosis -Will admit with telemetry -Suspect that mild renal and hepatic enzyme elevation is related to diminished blood flow in the setting of CHF and is likely to improve with diuretic treatment -Will request echocardiogram -Cardiology has been consulted, I spoke with Dr. Rennis Golden.  Of note, his parents both see Dr. Swaziland and they would like for him to also care for their son. -CTA (done because of elevated D-dimer, negative for PE) shows calcifications in his LAD; he likely needs ischemic testing and/or cath for further evaluation -Will start ASA -Will continue Cozaar -No beta blocker due to acute decompensation on presentation - but he is likely to need this medication -CHF order set utilized; may need CHF team consult but will hold until Echo results are available -Was given Lasix 20 mg x 1 in ER and will repeat with 40 mg BID -prn [place] O2 for now -Relatively normal kidney function at this time, will  follow -Repeat EKG in AM -Elevated troponin is likely associated with demand ischemia but will r/o with serial troponins  HTN -Hold and likely discontinue HCTZ -Continue Losartan -He is likely to need beta blocker (Coreg) once his acute exacerbation is stabilized  DM -Will check A1c -hold Glucophage and Glimepiride -Cover with moderate-scale SSI  HLD -He reports having had a high cholesterol once prior to onset of medications; this is not available at this time -Will substitute 40 mg Lipitor for Zocor at this time -Continue fish oil -Check FLP in AM  Asthma -With the timing, it is possible that his symptoms  were actually related to developing CHF - but uncontrolled asthma is also possible -His symptoms did improve with addition of a controller medication -Continue Symbicort and prn albuterol   DVT prophylaxis: Lovenox Code Status: Full Family Communication: Parents present throughout evaluation (they were excused from the sensitive questioning during the interview) Disposition Plan:  Home once clinically improved Consults called: Cardiology; CM; Nutrition  Admission status: Admit - It is my clinical opinion that admission to INPATIENT is reasonable and necessary because of the expectation that this patient will require hospital care that crosses at least 2 midnights to treat this condition based on the medical complexity of the problems presented.  Given the aforementioned information, the predictability of an adverse outcome is felt to be significant.    Jonah Blue MD [facility]  If note is complete, please contact covering daytime or nighttime physician. [email or web address] Password [facility]  02/02/2018, 5:40 PM

## 2018-02-03 ENCOUNTER — Inpatient Hospital Stay (HOSPITAL_COMMUNITY): Payer: No Typology Code available for payment source

## 2018-02-03 DIAGNOSIS — I361 Nonrheumatic tricuspid (valve) insufficiency: Secondary | ICD-10-CM

## 2018-02-03 DIAGNOSIS — I351 Nonrheumatic aortic (valve) insufficiency: Secondary | ICD-10-CM

## 2018-02-03 DIAGNOSIS — I5020 Unspecified systolic (congestive) heart failure: Secondary | ICD-10-CM

## 2018-02-03 HISTORY — DX: Unspecified systolic (congestive) heart failure: I50.20

## 2018-02-03 LAB — BASIC METABOLIC PANEL
Anion gap: 14 (ref 5–15)
BUN: 30 mg/dL — ABNORMAL HIGH (ref 6–20)
CO2: 24 mmol/L (ref 22–32)
CREATININE: 1.2 mg/dL (ref 0.61–1.24)
Calcium: 9.3 mg/dL (ref 8.9–10.3)
Chloride: 103 mmol/L (ref 101–111)
Glucose, Bld: 114 mg/dL — ABNORMAL HIGH (ref 65–99)
Potassium: 3.7 mmol/L (ref 3.5–5.1)
SODIUM: 141 mmol/L (ref 135–145)

## 2018-02-03 LAB — LIPID PANEL
Cholesterol: 95 mg/dL (ref 0–200)
HDL: 33 mg/dL — AB (ref >40)
LDL CALC: 44 mg/dL (ref 0–99)
TRIGLYCERIDES: 89 mg/dL (ref <150)
Total CHOL/HDL Ratio: 2.9 RATIO
VLDL: 18 mg/dL (ref 0–40)

## 2018-02-03 LAB — MAGNESIUM: Magnesium: 1.7 mg/dL (ref 1.7–2.4)

## 2018-02-03 LAB — ECHOCARDIOGRAM COMPLETE
HEIGHTINCHES: 76 in
WEIGHTICAEL: 4155.23 [oz_av]

## 2018-02-03 LAB — CBC WITH DIFFERENTIAL/PLATELET
BASOS PCT: 0 %
Basophils Absolute: 0 10*3/uL (ref 0.0–0.1)
EOS ABS: 0.2 10*3/uL (ref 0.0–0.7)
Eosinophils Relative: 1 %
HCT: 44.1 % (ref 39.0–52.0)
Hemoglobin: 14.4 g/dL (ref 13.0–17.0)
Lymphocytes Relative: 32 %
Lymphs Abs: 4.2 10*3/uL — ABNORMAL HIGH (ref 0.7–4.0)
MCH: 28.8 pg (ref 26.0–34.0)
MCHC: 32.7 g/dL (ref 30.0–36.0)
MCV: 88.2 fL (ref 78.0–100.0)
MONO ABS: 1 10*3/uL (ref 0.1–1.0)
MONOS PCT: 7 %
Neutro Abs: 8 10*3/uL — ABNORMAL HIGH (ref 1.7–7.7)
Neutrophils Relative %: 60 %
Platelets: 277 10*3/uL (ref 150–400)
RBC: 5 MIL/uL (ref 4.22–5.81)
RDW: 14.8 % (ref 11.5–15.5)
WBC: 13.4 10*3/uL — ABNORMAL HIGH (ref 4.0–10.5)

## 2018-02-03 LAB — HEMOGLOBIN A1C
Hgb A1c MFr Bld: 7.8 % — ABNORMAL HIGH (ref 4.8–5.6)
Mean Plasma Glucose: 177.16 mg/dL

## 2018-02-03 LAB — GLUCOSE, CAPILLARY
GLUCOSE-CAPILLARY: 115 mg/dL — AB (ref 65–99)
GLUCOSE-CAPILLARY: 199 mg/dL — AB (ref 65–99)
GLUCOSE-CAPILLARY: 176 mg/dL — AB (ref 65–99)
Glucose-Capillary: 136 mg/dL — ABNORMAL HIGH (ref 65–99)

## 2018-02-03 LAB — TROPONIN I
TROPONIN I: 0.17 ng/mL — AB (ref <0.03)
TROPONIN I: 0.24 ng/mL — AB (ref <0.03)

## 2018-02-03 LAB — HIV ANTIBODY (ROUTINE TESTING W REFLEX): HIV SCREEN 4TH GENERATION: NONREACTIVE

## 2018-02-03 MED ORDER — TRAZODONE HCL 50 MG PO TABS
25.0000 mg | ORAL_TABLET | Freq: Every evening | ORAL | Status: DC | PRN
  Administered 2018-02-03 – 2018-02-11 (×6): 25 mg via ORAL
  Filled 2018-02-03 (×6): qty 1

## 2018-02-03 MED ORDER — PERFLUTREN LIPID MICROSPHERE
1.0000 mL | INTRAVENOUS | Status: AC | PRN
  Filled 2018-02-03: qty 10

## 2018-02-03 MED ORDER — PERFLUTREN LIPID MICROSPHERE
INTRAVENOUS | Status: AC
  Administered 2018-02-03: 3 mL
  Filled 2018-02-03: qty 10

## 2018-02-03 NOTE — Plan of Care (Signed)
Nutrition Education Note  RD consulted for nutrition education regarding new onset CHF.  Intern provided "Heart Failure Nutrition Therapy" handout from the [employer]. Reviewed patient's dietary recall and provided examples on ways to decrease sodium intake in diet. Discouraged intake of processed foods and use of salt shaker.   Pt has seen an RD in the past regarding diabetic diet education. Pt reports his diabetes is well controlled through medication and diet.  Per pt's dietary recall, pt is eating a well balanced plant-based diet. Pt already avoids cooking with salt and chooses low sodium canned beans.  Discussed the importance of adequate protein intake and vegetarian sources of protein.  Discussed the importance of following dietary recommendations and the role of fluids/salt. Encouraged daily weighing and avoidance of salt substitutes.  Teach back method used.  Expect excellent compliance.  Body mass index is 31.61 kg/m. Pt meets criteria for obesity based on current BMI.  Current diet order is HH; advanced from NPO just before visit. Unable to evaluate percent meal intake. Pt reports great appetite, no N/V/D or constipation.  Labs and medications reviewed.   No further nutrition interventions warranted at this time. RD contact information provided. If additional nutrition issues arise, please re-consult RD.   Valori Hollenkamp, MS, Dietetic Intern Pager # [phone]

## 2018-02-03 NOTE — Progress Notes (Signed)
  Echocardiogram 2D Echocardiogram has been performed.  Curtis [name] [name] 02/03/2018, 3:16 PM

## 2018-02-03 NOTE — Progress Notes (Signed)
PROGRESS NOTE    Curtis [name]  ONG:[id number] DOB: Jul 16, 1976 DOA: 02/02/2018 PCP: Gweneth Dimitri, MD   Brief Narrative: Curtis [name] is a 42 y.o. male with medical history significant of DM and HTN. Patient presented with edema and dyspnea and admitted for CHF exacerbation. He has responded well to diuresis.   Assessment & Plan:   Principal Problem:   CHF exacerbation (HCC) Active Problems:   Asthma in adult, unspecified asthma severity, uncomplicated   Essential hypertension   Diabetes mellitus type 2, controlled (HCC)   Hyperlipidemia   Heart failure exacerbation Unknown etiology. Concern for ischemic cardiomyopathy. Function not yet assessed. CTA negative for PE. On room air. Weight is down 3 lbs from admission with a total UOP of 2L over the last 12 hours -Continue Lasix -Cardiology recommendations: R/L HC -Daily weights/strict in and out  Asthma Stable. -Continue Symbicort and albuterol  Essential hypertension -Continue losartan -Cardiology recommendations  Diabetes mellitus, type 2 Hemoglobin A1C of 7.8%. On metformin and glimepiride as an outpatient. -Continue SSI  Hyperlipidemia LDL of 44 -Continue Lipitor and fish oil   DVT prophylaxis: Lovenox Code Status:   Code Status: Full Code Family Communication: Mother at bedside Disposition Plan: Discharge pending cardiac workup   Consultants:   Cardiology  Procedures:   None  Antimicrobials:  None    Subjective: No chest pain or dyspnea this morning.  Objective: Vitals:   02/02/18 2012 02/03/18 0500 02/03/18 1131 02/03/18 1553  BP: 128/86 132/84  115/80  Pulse: (!) 105 (!) 101  95  Resp: 18 20    Temp: 98.6 F (37 C) 98 F (36.7 C)  97.8 F (36.6 C)  TempSrc: Oral Oral  Oral  SpO2: 99% 100% 96% 100%  Weight: 117.8 kg (259 lb 11.2 oz)     Height: 6\' 4"  (1.93 m)       Intake/Output Summary (Last 24 hours) at 02/03/2018 1602 Last data filed at 02/03/2018 4401 Gross per 24 hour    Intake 600 ml  Output 2075 ml  Net -1475 ml   Filed Weights   02/02/18 1837 02/02/18 2012  Weight: 118.8 kg (262 lb) 117.8 kg (259 lb 11.2 oz)    Examination:  General exam: Appears calm and comfortable Respiratory system: Mild rales. Respiratory effort normal. Cardiovascular system: S1 & S2 heard, RRR. No murmurs, rubs, gallops or clicks. Gastrointestinal system: Abdomen is nondistended, soft and nontender. No organomegaly or masses felt. Normal bowel sounds heard. Central nervous system: Alert and oriented. No focal neurological deficits. Extremities: No edema. No calf tenderness Skin: No cyanosis. No rashes Psychiatry: Judgement and insight appear normal. Mood & affect appropriate.     Data Reviewed: I have personally reviewed following labs and imaging studies  CBC: Recent Labs  Lab 02/02/18 1247 02/02/18 2341  WBC 14.0* 13.4*  NEUTROABS  --  8.0*  HGB 15.4 14.4  HCT 47.1 44.1  MCV 89.0 88.2  PLT 259 277   Basic Metabolic Panel: Recent Labs  Lab 02/02/18 1247 02/03/18 0459  NA 139 141  K 3.5 3.7  CL 107 103  CO2 21* 24  GLUCOSE 110* 114*  BUN 31* 30*  CREATININE 1.12 1.20  CALCIUM 9.3 9.3  MG  --  1.7   GFR: Estimated Creatinine Clearance: 110.2 mL/min (by C-G formula based on SCr of 1.2 mg/dL). Liver Function Tests: Recent Labs  Lab 02/02/18 1339  AST 33  ALT 16*  ALKPHOS 52  BILITOT 1.5*  PROT 6.7  ALBUMIN 3.6  No results for input(s): LIPASE, AMYLASE in the last 168 hours. No results for input(s): AMMONIA in the last 168 hours. Coagulation Profile: No results for input(s): INR, PROTIME in the last 168 hours. Cardiac Enzymes: Recent Labs  Lab 02/02/18 1732 02/02/18 2341 02/03/18 0459  TROPONINI 0.20* 0.17* 0.24*   BNP (last 3 results) No results for input(s): PROBNP in the last 8760 hours. HbA1C: Recent Labs    02/02/18 2341  HGBA1C 7.8*   CBG: Recent Labs  Lab 02/02/18 2116 02/03/18 0803 02/03/18 1107  GLUCAP 155*  115* 136*   Lipid Profile: Recent Labs    02/03/18 0459  CHOL 95  HDL 33*  LDLCALC 44  TRIG 89  CHOLHDL 2.9   Thyroid Function Tests: Recent Labs    02/02/18 1342  TSH 3.207   Anemia Panel: No results for input(s): VITAMINB12, FOLATE, FERRITIN, TIBC, IRON, RETICCTPCT in the last 72 hours. Sepsis Labs: No results for input(s): PROCALCITON, LATICACIDVEN in the last 168 hours.  No results found for this or any previous visit (from the past 240 hour(s)).       Radiology Studies: Dg Chest 2 View  Result Date: 02/02/2018 CLINICAL DATA:  Shortness of breath, lower extremity and abdominal swelling, and 15 pound weight gain over the past 3 days. History of diabetes and asthma but no history of CHF. EXAM: CHEST - 2 VIEW COMPARISON:  None in PACs FINDINGS: The lungs are adequately inflated. There are bilateral pleural effusions greatest on the right. The lung markings are coarse in the left retrocardiac region. The cardiac silhouette is enlarged. The central pulmonary vascularity is engorged and there is mild cephalization. The mediastinum is normal in width. There is multilevel degenerative disc disease of the thoracic spine. IMPRESSION: CHF with mild interstitial edema and bilateral pleural effusions. Electronically Signed   By: David  [place] M.D.   On: 02/02/2018 13:07   Ct Angio Chest Pe W And/or Wo Contrast  Result Date: 02/02/2018 CLINICAL DATA:  Short of breath, lower extremity swelling for 5 days, positive D-dimer EXAM: CT ANGIOGRAPHY CHEST WITH CONTRAST TECHNIQUE: Multidetector CT imaging of the chest was performed using the standard protocol during bolus administration of intravenous contrast. Multiplanar CT image reconstructions and MIPs were obtained to evaluate the vascular anatomy. CONTRAST:  ISOVUE-370 IOPAMIDOL (ISOVUE-370) INJECTION 76% COMPARISON:  Chest x-ray of 02/02/2018 FINDINGS: Cardiovascular: There is moderate cardiomegaly present there may be a small  pericardial effusion present. There is good opacification of the pulmonary arteries. There is no evidence of acute pulmonary embolism. Diffuse coronary artery calcifications are noted primarily in the left anterior descending distribution. The mid ascending thoracic aorta measures 39 mm in diameter. Mediastinum/Nodes: No mediastinal or hilar adenopathy is noted. The thyroid gland is unremarkable although not well visualized. Esophagus is unremarkable. Lungs/Pleura: On lung window images, there are bilateral pleural effusions right larger than left with resultant bibasilar atelectasis posteriorly. Pneumonia would be difficult to exclude particularly involving the right lower lobe although this appearance most likely reflects atelectasis. No suspicious lung nodule is seen. Upper Abdomen: Images through the upper abdomen show no significant abnormality. There is some reflux of contrast into the hepatic veins. Musculoskeletal: The thoracic vertebrae are in normal alignment with moderate degenerative spurring throughout the thoracic spine. No compression deformity is seen. Review of the MIP images confirms the above findings. IMPRESSION: 1. No evidence of acute pulmonary embolism. 2. Cardiomegaly, bilateral pleural effusions, right greater than left, findings most consistent with CHF. 3. Small pericardial effusion. 4.  Coronary artery calcifications primarily within the left anterior descending artery. Electronically Signed   By: Dwyane Dee M.D.   On: 02/02/2018 15:45        Scheduled Meds: . aspirin  81 mg Oral Daily  . atorvastatin  40 mg Oral q1800  . carvedilol  3.125 mg Oral BID WC  . enoxaparin (LOVENOX) injection  40 mg Subcutaneous Q24H  . fluticasone  1 spray Each Nare Daily  . furosemide  40 mg Intravenous Q12H  . insulin aspart  0-15 Units Subcutaneous TID WC  . insulin aspart  0-5 Units Subcutaneous QHS  . losartan  100 mg Oral Daily  . mometasone-formoterol  2 puff Inhalation BID  . omega-3  acid ethyl esters  1 g Oral BID  . sertraline  100 mg Oral Daily  . sodium chloride flush  3 mL Intravenous Q12H   Continuous Infusions: . sodium chloride       LOS: 1 day     Jacquelin Hawking, MD [facility] 02/03/2018, 4:02 PM Pager: [phone]  If 7PM-7AM, please contact night-coverage [email or web address] Password [facility] 02/03/2018, 4:02 PM

## 2018-02-03 NOTE — Progress Notes (Signed)
Progress Note  Patient Name: Curtis [name] Date of Encounter: 02/03/2018  Primary Cardiologist: New to Dr. Rennis Golden  Subjective   Breathing and edema improving. No chest pain.   Inpatient Medications    Scheduled Meds: . aspirin  81 mg Oral Daily  . atorvastatin  40 mg Oral q1800  . carvedilol  3.125 mg Oral BID WC  . enoxaparin (LOVENOX) injection  40 mg Subcutaneous Q24H  . fluticasone  1 spray Each Nare Daily  . furosemide  40 mg Intravenous Q12H  . insulin aspart  0-15 Units Subcutaneous TID WC  . insulin aspart  0-5 Units Subcutaneous QHS  . losartan  100 mg Oral Daily  . mometasone-formoterol  2 puff Inhalation BID  . omega-3 acid ethyl esters  1 g Oral BID  . sertraline  100 mg Oral Daily  . sodium chloride flush  3 mL Intravenous Q12H   Continuous Infusions: . sodium chloride     PRN Meds: sodium chloride, acetaminophen, albuterol, ondansetron (ZOFRAN) IV, sodium chloride flush   Vital Signs    Vitals:   02/02/18 1837 02/02/18 1930 02/02/18 2012 02/03/18 0500  BP:  (!) 139/94 128/86 132/84  Pulse:  (!) 106 (!) 105 (!) 101  Resp:  (!) 27 18 20   Temp:   98.6 F (37 C) 98 F (36.7 C)  TempSrc:   Oral Oral  SpO2:  99% 99% 100%  Weight: 262 lb (118.8 kg)  259 lb 11.2 oz (117.8 kg)   Height: 6\' 2"  (1.88 m)  6\' 4"  (1.93 m)     Intake/Output Summary (Last 24 hours) at 02/03/2018 0854 Last data filed at 02/03/2018 0300 Gross per 24 hour  Intake 600 ml  Output 2075 ml  Net -1475 ml   Filed Weights   02/02/18 1837 02/02/18 2012  Weight: 262 lb (118.8 kg) 259 lb 11.2 oz (117.8 kg)    Telemetry    Sinus rhythm with frequent PVCs - Personally Reviewed  ECG    Sinus rhythm with non specific TWI anterior laterally  - Personally Reviewed  Physical Exam   GEN: No acute distress.   Neck: +  JVD Cardiac: RRR, no murmurs, rubs, or gallops.  Respiratory: Clear to auscultation bilaterally. GI: Soft, nontender, non-distended  MS: 1-2 + BL LE edema; No  deformity. Neuro:  Nonfocal  Psych: Normal affect   Labs    Chemistry Recent Labs  Lab 02/02/18 1247 02/02/18 1339 02/03/18 0459  NA 139  --  141  K 3.5  --  3.7  CL 107  --  103  CO2 21*  --  24  GLUCOSE 110*  --  114*  BUN 31*  --  30*  CREATININE 1.12  --  1.20  CALCIUM 9.3  --  9.3  PROT  --  6.7  --   ALBUMIN  --  3.6  --   AST  --  33  --   ALT  --  16*  --   ALKPHOS  --  52  --   BILITOT  --  1.5*  --   GFRNONAA >60  --  >60  GFRAA >60  --  >60  ANIONGAP 11  --  14     Hematology Recent Labs  Lab 02/02/18 1247 02/02/18 2341  WBC 14.0* 13.4*  RBC 5.29 5.00  HGB 15.4 14.4  HCT 47.1 44.1  MCV 89.0 88.2  MCH 29.1 28.8  MCHC 32.7 32.7  RDW 14.8 14.8  PLT 259  277    Cardiac Enzymes Recent Labs  Lab 02/02/18 1732 02/02/18 2341 02/03/18 0459  TROPONINI 0.20* 0.17* 0.24*    Recent Labs  Lab 02/02/18 1306  TROPIPOC 0.16*     BNP Recent Labs  Lab 02/02/18 1329  BNP 1,724.6*     DDimer  Recent Labs  Lab 02/02/18 1339  DDIMER 1.54*     Radiology    Dg Chest 2 View  Result Date: 02/02/2018 CLINICAL DATA:  Shortness of breath, lower extremity and abdominal swelling, and 15 pound weight gain over the past 3 days. History of diabetes and asthma but no history of CHF. EXAM: CHEST - 2 VIEW COMPARISON:  None in PACs FINDINGS: The lungs are adequately inflated. There are bilateral pleural effusions greatest on the right. The lung markings are coarse in the left retrocardiac region. The cardiac silhouette is enlarged. The central pulmonary vascularity is engorged and there is mild cephalization. The mediastinum is normal in width. There is multilevel degenerative disc disease of the thoracic spine. IMPRESSION: CHF with mild interstitial edema and bilateral pleural effusions. Electronically Signed   By: David  [place] M.D.   On: 02/02/2018 13:07   Ct Angio Chest Pe W And/or Wo Contrast  Result Date: 02/02/2018 CLINICAL DATA:  Short of breath, lower  extremity swelling for 5 days, positive D-dimer EXAM: CT ANGIOGRAPHY CHEST WITH CONTRAST TECHNIQUE: Multidetector CT imaging of the chest was performed using the standard protocol during bolus administration of intravenous contrast. Multiplanar CT image reconstructions and MIPs were obtained to evaluate the vascular anatomy. CONTRAST:  ISOVUE-370 IOPAMIDOL (ISOVUE-370) INJECTION 76% COMPARISON:  Chest x-ray of 02/02/2018 FINDINGS: Cardiovascular: There is moderate cardiomegaly present there may be a small pericardial effusion present. There is good opacification of the pulmonary arteries. There is no evidence of acute pulmonary embolism. Diffuse coronary artery calcifications are noted primarily in the left anterior descending distribution. The mid ascending thoracic aorta measures 39 mm in diameter. Mediastinum/Nodes: No mediastinal or hilar adenopathy is noted. The thyroid gland is unremarkable although not well visualized. Esophagus is unremarkable. Lungs/Pleura: On lung window images, there are bilateral pleural effusions right larger than left with resultant bibasilar atelectasis posteriorly. Pneumonia would be difficult to exclude particularly involving the right lower lobe although this appearance most likely reflects atelectasis. No suspicious lung nodule is seen. Upper Abdomen: Images through the upper abdomen show no significant abnormality. There is some reflux of contrast into the hepatic veins. Musculoskeletal: The thoracic vertebrae are in normal alignment with moderate degenerative spurring throughout the thoracic spine. No compression deformity is seen. Review of the MIP images confirms the above findings. IMPRESSION: 1. No evidence of acute pulmonary embolism. 2. Cardiomegaly, bilateral pleural effusions, right greater than left, findings most consistent with CHF. 3. Small pericardial effusion. 4. Coronary artery calcifications primarily within the left anterior descending artery.  Electronically Signed   By: Dwyane Dee M.D.   On: 02/02/2018 15:45    Cardiac Studies   Pending echo   Patient Profile     42 y.o. male with DM2, HTN, dyslipidemia and family history of CAD (parents are patient of Dr. Swaziland) presents with SOB, LE edema, orthopnea and weight gain (15 lb).  He reports running out of his hypertension meds in February   Assessment & Plan    1. Acute CHF, unspecific - BNP 1724. Pending echo today.  - Net I & O -1.4L. Weight down 3 lb. Continue lasix, coreg and ARB.   2. Elevated troponin - troponin  of 0.2->0.17->0.24. No change pain. Noted coronary calcification (in LAD) on CT angio of chest. Continue ASA and statin.  - Likely need R & LHC this admission, pending echo.   3. HTN - BP stable on current medications.   4. PVCs - Frequent. K normal. Check Mg. Consider increasing BB.   5. HLD - 02/03/2018: Cholesterol 95; HDL 33; LDL Cholesterol 44; Triglycerides 89; VLDL 18  - Continue statin  6. DM - Per primary team  For questions or updates, please contact [facility] Please consult [email or web address] for contact info under Cardiology/STEMI.      Lorelei Pont, PA  02/03/2018, 8:54 AM

## 2018-02-03 NOTE — H&P (View-Only) (Signed)
Progress Note  Patient Name: Curtis [name] Date of Encounter: 02/03/2018  Primary Cardiologist: New to Dr. Rennis Golden  Subjective   Breathing and edema improving. No chest pain.   Inpatient Medications    Scheduled Meds: . aspirin  81 mg Oral Daily  . atorvastatin  40 mg Oral q1800  . carvedilol  3.125 mg Oral BID WC  . enoxaparin (LOVENOX) injection  40 mg Subcutaneous Q24H  . fluticasone  1 spray Each Nare Daily  . furosemide  40 mg Intravenous Q12H  . insulin aspart  0-15 Units Subcutaneous TID WC  . insulin aspart  0-5 Units Subcutaneous QHS  . losartan  100 mg Oral Daily  . mometasone-formoterol  2 puff Inhalation BID  . omega-3 acid ethyl esters  1 g Oral BID  . sertraline  100 mg Oral Daily  . sodium chloride flush  3 mL Intravenous Q12H   Continuous Infusions: . sodium chloride     PRN Meds: sodium chloride, acetaminophen, albuterol, ondansetron (ZOFRAN) IV, sodium chloride flush   Vital Signs    Vitals:   02/02/18 1837 02/02/18 1930 02/02/18 2012 02/03/18 0500  BP:  (!) 139/94 128/86 132/84  Pulse:  (!) 106 (!) 105 (!) 101  Resp:  (!) 27 18 20   Temp:   98.6 F (37 C) 98 F (36.7 C)  TempSrc:   Oral Oral  SpO2:  99% 99% 100%  Weight: 262 lb (118.8 kg)  259 lb 11.2 oz (117.8 kg)   Height: 6\' 2"  (1.88 m)  6\' 4"  (1.93 m)     Intake/Output Summary (Last 24 hours) at 02/03/2018 0854 Last data filed at 02/03/2018 0300 Gross per 24 hour  Intake 600 ml  Output 2075 ml  Net -1475 ml   Filed Weights   02/02/18 1837 02/02/18 2012  Weight: 262 lb (118.8 kg) 259 lb 11.2 oz (117.8 kg)    Telemetry    Sinus rhythm with frequent PVCs - Personally Reviewed  ECG    Sinus rhythm with non specific TWI anterior laterally  - Personally Reviewed  Physical Exam   GEN: No acute distress.   Neck: +  JVD Cardiac: RRR, no murmurs, rubs, or gallops.  Respiratory: Clear to auscultation bilaterally. GI: Soft, nontender, non-distended  MS: 1-2 + BL LE edema; No  deformity. Neuro:  Nonfocal  Psych: Normal affect   Labs    Chemistry Recent Labs  Lab 02/02/18 1247 02/02/18 1339 02/03/18 0459  NA 139  --  141  K 3.5  --  3.7  CL 107  --  103  CO2 21*  --  24  GLUCOSE 110*  --  114*  BUN 31*  --  30*  CREATININE 1.12  --  1.20  CALCIUM 9.3  --  9.3  PROT  --  6.7  --   ALBUMIN  --  3.6  --   AST  --  33  --   ALT  --  16*  --   ALKPHOS  --  52  --   BILITOT  --  1.5*  --   GFRNONAA >60  --  >60  GFRAA >60  --  >60  ANIONGAP 11  --  14     Hematology Recent Labs  Lab 02/02/18 1247 02/02/18 2341  WBC 14.0* 13.4*  RBC 5.29 5.00  HGB 15.4 14.4  HCT 47.1 44.1  MCV 89.0 88.2  MCH 29.1 28.8  MCHC 32.7 32.7  RDW 14.8 14.8  PLT 259  277    Cardiac Enzymes Recent Labs  Lab 02/02/18 1732 02/02/18 2341 02/03/18 0459  TROPONINI 0.20* 0.17* 0.24*    Recent Labs  Lab 02/02/18 1306  TROPIPOC 0.16*     BNP Recent Labs  Lab 02/02/18 1329  BNP 1,724.6*     DDimer  Recent Labs  Lab 02/02/18 1339  DDIMER 1.54*     Radiology    Dg Chest 2 View  Result Date: 02/02/2018 CLINICAL DATA:  Shortness of breath, lower extremity and abdominal swelling, and 15 pound weight gain over the past 3 days. History of diabetes and asthma but no history of CHF. EXAM: CHEST - 2 VIEW COMPARISON:  None in PACs FINDINGS: The lungs are adequately inflated. There are bilateral pleural effusions greatest on the right. The lung markings are coarse in the left retrocardiac region. The cardiac silhouette is enlarged. The central pulmonary vascularity is engorged and there is mild cephalization. The mediastinum is normal in width. There is multilevel degenerative disc disease of the thoracic spine. IMPRESSION: CHF with mild interstitial edema and bilateral pleural effusions. Electronically Signed   By: David  [place] M.D.   On: 02/02/2018 13:07   Ct Angio Chest Pe W And/or Wo Contrast  Result Date: 02/02/2018 CLINICAL DATA:  Short of breath, lower  extremity swelling for 5 days, positive D-dimer EXAM: CT ANGIOGRAPHY CHEST WITH CONTRAST TECHNIQUE: Multidetector CT imaging of the chest was performed using the standard protocol during bolus administration of intravenous contrast. Multiplanar CT image reconstructions and MIPs were obtained to evaluate the vascular anatomy. CONTRAST:  ISOVUE-370 IOPAMIDOL (ISOVUE-370) INJECTION 76% COMPARISON:  Chest x-ray of 02/02/2018 FINDINGS: Cardiovascular: There is moderate cardiomegaly present there may be a small pericardial effusion present. There is good opacification of the pulmonary arteries. There is no evidence of acute pulmonary embolism. Diffuse coronary artery calcifications are noted primarily in the left anterior descending distribution. The mid ascending thoracic aorta measures 39 mm in diameter. Mediastinum/Nodes: No mediastinal or hilar adenopathy is noted. The thyroid gland is unremarkable although not well visualized. Esophagus is unremarkable. Lungs/Pleura: On lung window images, there are bilateral pleural effusions right larger than left with resultant bibasilar atelectasis posteriorly. Pneumonia would be difficult to exclude particularly involving the right lower lobe although this appearance most likely reflects atelectasis. No suspicious lung nodule is seen. Upper Abdomen: Images through the upper abdomen show no significant abnormality. There is some reflux of contrast into the hepatic veins. Musculoskeletal: The thoracic vertebrae are in normal alignment with moderate degenerative spurring throughout the thoracic spine. No compression deformity is seen. Review of the MIP images confirms the above findings. IMPRESSION: 1. No evidence of acute pulmonary embolism. 2. Cardiomegaly, bilateral pleural effusions, right greater than left, findings most consistent with CHF. 3. Small pericardial effusion. 4. Coronary artery calcifications primarily within the left anterior descending artery.  Electronically Signed   By: Dwyane Dee M.D.   On: 02/02/2018 15:45    Cardiac Studies   Pending echo   Patient Profile     42 y.o. male with DM2, HTN, dyslipidemia and family history of CAD (parents are patient of Dr. Sw[place]iland) presents with SOB, LE edema, orthopnea and weight gain (15 lb).  He reports running out of his hypertension meds in February   Assessment & Plan    1. Acute CHF, unspecific - BNP 1724. Pending echo today.  - Net I & O -1.4L. Weight down 3 lb. Continue lasix, coreg and ARB.   2. Elevated troponin - troponin  of 0.2->0.17->0.24. No change pain. Noted coronary calcification (in LAD) on CT angio of chest. Continue ASA and statin.  - Likely need R & LHC this admission, pending echo.   3. HTN - BP stable on current medications.   4. PVCs - Frequent. K normal. Check Mg. Consider increasing BB.   5. HLD - 02/03/2018: Cholesterol 95; HDL 33; LDL Cholesterol 44; Triglycerides 89; VLDL 18  - Continue statin  6. DM - Per primary team  For questions or updates, please contact [facility] Please consult [email or web address] for contact info under Cardiology/STEMI.      Lorelei Pont, PA  02/03/2018, 8:54 AM

## 2018-02-04 ENCOUNTER — Encounter (HOSPITAL_COMMUNITY): Payer: Self-pay | Admitting: Cardiology

## 2018-02-04 ENCOUNTER — Inpatient Hospital Stay: Payer: Self-pay

## 2018-02-04 ENCOUNTER — Inpatient Hospital Stay (HOSPITAL_COMMUNITY)
Admission: EM | Disposition: A | Discharge status: Home / Self Care | Payer: Self-pay | Source: Home / Self Care | Attending: Cardiology

## 2018-02-04 DIAGNOSIS — I2510 Atherosclerotic heart disease of native coronary artery without angina pectoris: Secondary | ICD-10-CM

## 2018-02-04 DIAGNOSIS — R778 Other specified abnormalities of plasma proteins: Secondary | ICD-10-CM

## 2018-02-04 DIAGNOSIS — R748 Abnormal levels of other serum enzymes: Secondary | ICD-10-CM

## 2018-02-04 DIAGNOSIS — Q231 Congenital insufficiency of aortic valve: Secondary | ICD-10-CM

## 2018-02-04 DIAGNOSIS — I5023 Acute on chronic systolic (congestive) heart failure: Secondary | ICD-10-CM

## 2018-02-04 DIAGNOSIS — I5043 Acute on chronic combined systolic (congestive) and diastolic (congestive) heart failure: Secondary | ICD-10-CM

## 2018-02-04 DIAGNOSIS — R570 Cardiogenic shock: Secondary | ICD-10-CM

## 2018-02-04 DIAGNOSIS — R7989 Other specified abnormal findings of blood chemistry: Secondary | ICD-10-CM

## 2018-02-04 DIAGNOSIS — E1165 Type 2 diabetes mellitus with hyperglycemia: Secondary | ICD-10-CM

## 2018-02-04 DIAGNOSIS — I493 Ventricular premature depolarization: Secondary | ICD-10-CM

## 2018-02-04 HISTORY — DX: Atherosclerotic heart disease of native coronary artery without angina pectoris: I25.10

## 2018-02-04 HISTORY — PX: RIGHT/LEFT HEART CATH AND CORONARY ANGIOGRAPHY: CATH118266

## 2018-02-04 LAB — GLUCOSE, CAPILLARY
GLUCOSE-CAPILLARY: 146 mg/dL — AB (ref 65–99)
Glucose-Capillary: 149 mg/dL — ABNORMAL HIGH (ref 65–99)
Glucose-Capillary: 183 mg/dL — ABNORMAL HIGH (ref 65–99)
Glucose-Capillary: 205 mg/dL — ABNORMAL HIGH (ref 65–99)

## 2018-02-04 LAB — BASIC METABOLIC PANEL
ANION GAP: 10 (ref 5–15)
BUN: 31 mg/dL — ABNORMAL HIGH (ref 6–20)
CO2: 25 mmol/L (ref 22–32)
Calcium: 8.6 mg/dL — ABNORMAL LOW (ref 8.9–10.3)
Chloride: 101 mmol/L (ref 101–111)
Creatinine, Ser: 1.14 mg/dL (ref 0.61–1.24)
GFR calc Af Amer: >60 mL/min (ref >60)
GFR calc non Af Amer: >60 mL/min (ref >60)
GLUCOSE: 135 mg/dL — AB (ref 65–99)
POTASSIUM: 3.1 mmol/L — AB (ref 3.5–5.1)
Sodium: 136 mmol/L (ref 135–145)

## 2018-02-04 LAB — MAGNESIUM: Magnesium: 1.7 mg/dL (ref 1.7–2.4)

## 2018-02-04 LAB — ABO/RH: ABO/RH(D): A POS

## 2018-02-04 LAB — COOXEMETRY PANEL
CARBOXYHEMOGLOBIN: 1.5 % (ref 0.5–1.5)
Methemoglobin: 0.7 % (ref 0.0–1.5)
O2 SAT: 63.7 %
Total hemoglobin: 13.3 g/dL (ref 12.0–16.0)

## 2018-02-04 LAB — TYPE AND SCREEN
ABO/RH(D): A POS
ANTIBODY SCREEN: NEGATIVE

## 2018-02-04 LAB — MRSA PCR SCREENING: MRSA BY PCR: NEGATIVE

## 2018-02-04 SURGERY — RIGHT/LEFT HEART CATH AND CORONARY ANGIOGRAPHY
Anesthesia: LOCAL

## 2018-02-04 MED ORDER — HEPARIN SODIUM (PORCINE) 1000 UNIT/ML IJ SOLN
INTRAMUSCULAR | Status: AC
  Filled 2018-02-04: qty 1

## 2018-02-04 MED ORDER — HEPARIN (PORCINE) IN NACL 2-0.9 UNIT/ML-% IJ SOLN
INTRAMUSCULAR | Status: AC | PRN
  Administered 2018-02-04 (×2): 500 mL

## 2018-02-04 MED ORDER — SODIUM CHLORIDE 0.9 % IV SOLN: INTRAVENOUS | Status: DC

## 2018-02-04 MED ORDER — LIDOCAINE HCL 1 % IJ SOLN
INTRAMUSCULAR | Status: AC
  Filled 2018-02-04: qty 20

## 2018-02-04 MED ORDER — LIDOCAINE HCL (PF) 1 % IJ SOLN
INTRAMUSCULAR | Status: DC | PRN
  Administered 2018-02-04: 1 mL via INTRADERMAL
  Administered 2018-02-04: .5 mL via INTRADERMAL

## 2018-02-04 MED ORDER — FENTANYL CITRATE (PF) 100 MCG/2ML IJ SOLN
INTRAMUSCULAR | Status: DC | PRN
  Administered 2018-02-04: 25 ug via INTRAVENOUS

## 2018-02-04 MED ORDER — MIDAZOLAM HCL 2 MG/2ML IJ SOLN
INTRAMUSCULAR | Status: AC
  Filled 2018-02-04: qty 2

## 2018-02-04 MED ORDER — SODIUM CHLORIDE 0.9% FLUSH: 3.0000 mL | INTRAVENOUS | Status: DC | PRN

## 2018-02-04 MED ORDER — LORAZEPAM 2 MG/ML IJ SOLN: 0.5000 mg | Freq: Once | INTRAMUSCULAR | Status: DC | PRN

## 2018-02-04 MED ORDER — MAGNESIUM SULFATE 4 GM/100ML IV SOLN
4.0000 g | Freq: Once | INTRAVENOUS | Status: AC
  Administered 2018-02-04: 4 g via INTRAVENOUS
  Filled 2018-02-04: qty 100

## 2018-02-04 MED ORDER — SODIUM CHLORIDE 0.9% FLUSH: 3.0000 mL | Freq: Two times a day (BID) | INTRAVENOUS | Status: DC

## 2018-02-04 MED ORDER — POTASSIUM CHLORIDE CRYS ER 20 MEQ PO TBCR
40.0000 meq | EXTENDED_RELEASE_TABLET | Freq: Once | ORAL | Status: AC
  Administered 2018-02-04: 40 meq via ORAL
  Filled 2018-02-04: qty 2

## 2018-02-04 MED ORDER — FUROSEMIDE 10 MG/ML IJ SOLN: 60.0000 mg | Freq: Two times a day (BID) | INTRAMUSCULAR | Status: DC

## 2018-02-04 MED ORDER — SODIUM CHLORIDE 0.9 % IV SOLN
INTRAVENOUS | Status: DC
  Administered 2018-02-04: 09:00:00 via INTRAVENOUS

## 2018-02-04 MED ORDER — ASPIRIN 81 MG PO CHEW
81.0000 mg | CHEWABLE_TABLET | ORAL | Status: AC
  Administered 2018-02-04: 81 mg via ORAL

## 2018-02-04 MED ORDER — ASPIRIN 81 MG PO CHEW
81.0000 mg | CHEWABLE_TABLET | Freq: Every day | ORAL | Status: DC
  Administered 2018-02-05 – 2018-02-12 (×8): 81 mg via ORAL
  Filled 2018-02-04 (×8): qty 1

## 2018-02-04 MED ORDER — DIGOXIN 125 MCG PO TABS
0.1250 mg | ORAL_TABLET | Freq: Every day | ORAL | Status: DC
  Administered 2018-02-04 – 2018-02-12 (×9): 0.125 mg via ORAL
  Filled 2018-02-04 (×9): qty 1

## 2018-02-04 MED ORDER — FUROSEMIDE 10 MG/ML IJ SOLN
INTRAMUSCULAR | Status: AC
Start: 2018-02-04 — End: ?
  Filled 2018-02-04: qty 4

## 2018-02-04 MED ORDER — SODIUM CHLORIDE 0.9% FLUSH: 10.0000 mL | INTRAVENOUS | Status: DC | PRN

## 2018-02-04 MED ORDER — VERAPAMIL HCL 2.5 MG/ML IV SOLN
INTRAVENOUS | Status: AC
  Filled 2018-02-04: qty 2

## 2018-02-04 MED ORDER — HEPARIN (PORCINE) IN NACL 2-0.9 UNIT/ML-% IJ SOLN
INTRAMUSCULAR | Status: AC
  Filled 2018-02-04: qty 1000

## 2018-02-04 MED ORDER — SODIUM CHLORIDE 0.9 % IV SOLN: 250.0000 mL | INTRAVENOUS | Status: DC | PRN

## 2018-02-04 MED ORDER — POTASSIUM CHLORIDE CRYS ER 20 MEQ PO TBCR
40.0000 meq | EXTENDED_RELEASE_TABLET | Freq: Two times a day (BID) | ORAL | Status: AC
  Administered 2018-02-04 (×2): 40 meq via ORAL
  Filled 2018-02-04 (×2): qty 2

## 2018-02-04 MED ORDER — IOPAMIDOL (ISOVUE-370) INJECTION 76%
INTRAVENOUS | Status: AC
  Filled 2018-02-04: qty 100

## 2018-02-04 MED ORDER — MIDAZOLAM HCL 2 MG/2ML IJ SOLN
INTRAMUSCULAR | Status: DC | PRN
  Administered 2018-02-04: 1 mg via INTRAVENOUS

## 2018-02-04 MED ORDER — MILRINONE LACTATE IN DEXTROSE 20-5 MG/100ML-% IV SOLN
0.3750 ug/kg/min | INTRAVENOUS | Status: DC
  Administered 2018-02-04 – 2018-02-05 (×3): 0.25 ug/kg/min via INTRAVENOUS
  Administered 2018-02-05 – 2018-02-08 (×13): 0.5 ug/kg/min via INTRAVENOUS
  Administered 2018-02-08 – 2018-02-09 (×3): 0.375 ug/kg/min via INTRAVENOUS
  Filled 2018-02-04 (×5): qty 100
  Filled 2018-02-04 (×2): qty 200
  Filled 2018-02-04: qty 100
  Filled 2018-02-04: qty 200
  Filled 2018-02-04 (×6): qty 100

## 2018-02-04 MED ORDER — POTASSIUM CHLORIDE 10 MEQ/100ML IV SOLN: 10.0000 meq | INTRAVENOUS | Status: DC

## 2018-02-04 MED ORDER — SODIUM CHLORIDE 0.9% FLUSH
10.0000 mL | Freq: Two times a day (BID) | INTRAVENOUS | Status: DC
  Administered 2018-02-04 – 2018-02-07 (×4): 10 mL
  Administered 2018-02-10: 20 mL
  Administered 2018-02-10 – 2018-02-11 (×2): 10 mL

## 2018-02-04 MED ORDER — SODIUM CHLORIDE 0.9% FLUSH
3.0000 mL | Freq: Two times a day (BID) | INTRAVENOUS | Status: DC
  Administered 2018-02-04 – 2018-02-07 (×4): 3 mL via INTRAVENOUS
  Administered 2018-02-08: 30 mL via INTRAVENOUS

## 2018-02-04 MED ORDER — HEPARIN (PORCINE) IN NACL 2-0.9 UNIT/ML-% IJ SOLN
INTRAMUSCULAR | Status: DC | PRN
  Administered 2018-02-04: 10 mL via INTRA_ARTERIAL

## 2018-02-04 MED ORDER — IOPAMIDOL (ISOVUE-370) INJECTION 76%
INTRAVENOUS | Status: DC | PRN
  Administered 2018-02-04: 55 mL via INTRA_ARTERIAL

## 2018-02-04 MED ORDER — HEPARIN SODIUM (PORCINE) 1000 UNIT/ML IJ SOLN
INTRAMUSCULAR | Status: DC | PRN
  Administered 2018-02-04: 5000 [IU] via INTRAVENOUS

## 2018-02-04 MED ORDER — POTASSIUM CHLORIDE CRYS ER 20 MEQ PO TBCR
EXTENDED_RELEASE_TABLET | ORAL | Status: AC
  Filled 2018-02-04: qty 2

## 2018-02-04 MED ORDER — FENTANYL CITRATE (PF) 100 MCG/2ML IJ SOLN
INTRAMUSCULAR | Status: AC
  Filled 2018-02-04: qty 2

## 2018-02-04 MED ORDER — SACUBITRIL-VALSARTAN 24-26 MG PO TABS
1.0000 | ORAL_TABLET | Freq: Two times a day (BID) | ORAL | Status: DC
  Administered 2018-02-05 – 2018-02-07 (×5): 1 via ORAL
  Filled 2018-02-04 (×6): qty 1

## 2018-02-04 SURGICAL SUPPLY — 13 items
BAND ZEPHYR COMPRESS 30 LONG (HEMOSTASIS) ×2 IMPLANT
CATH BALLN WEDGE 5F 110CM (CATHETERS) ×2 IMPLANT
CATH IMPULSE 5F ANG/FL3.5 (CATHETERS) ×2 IMPLANT
GUIDEWIRE INQWIRE 1.5J.035X260 (WIRE) ×1 IMPLANT
INQWIRE 1.5J .035X260CM (WIRE) ×2
KIT HEART LEFT (KITS) ×2 IMPLANT
NEEDLE PERC ENTRY 21G 2.5CM (NEEDLE) ×2 IMPLANT
PACK CARDIAC CATHETERIZATION (CUSTOM PROCEDURE TRAY) ×2 IMPLANT
SHEATH RAIN 4/5FR (SHEATH) ×2 IMPLANT
SHEATH RAIN RADIAL 21G 6FR (SHEATH) ×2 IMPLANT
TRANSDUCER W/STOPCOCK (MISCELLANEOUS) ×2 IMPLANT
TUBING CIL FLEX 10 FLL-RA (TUBING) ×2 IMPLANT
WIRE EMERALD 3MM-J .025X260CM (WIRE) ×2 IMPLANT

## 2018-02-04 NOTE — Progress Notes (Signed)
PROGRESS NOTE    Curtis [name]  NTI:[id number] DOB: 06/19/1976 DOA: 02/02/2018 PCP: Gweneth Dimitri, MD   Brief Narrative: Curtis [name] is a 42 y.o. male with medical history significant of DM and HTN. Patient presented with edema and dyspnea and admitted for CHF exacerbation. He has responded well to diuresis.   Assessment & Plan:   Principal Problem:   CHF exacerbation (HCC) Active Problems:   Asthma in adult, unspecified asthma severity, uncomplicated   Essential hypertension   Diabetes mellitus type 2, controlled (HCC)   Hyperlipidemia   Acute combined systolic and diastolic heart failure Unknown etiology. Concern for ischemic cardiomyopathy. Function severely diminished at 15-20%. Bicuspid aortic valve with moderate regurgitation. CTA negative for PE. On room air. Weight is down 8 lbs from admission with a total UOP of 2.1L over the last 24 hours. Not at baseline. -Continue IV Lasix -Cardiology recommendations: R/L HC today -Daily weights/strict in and out  Asthma Stable. -Continue Symbicort and albuterol  Essential hypertension -Continue losartan -Cardiology recommendations  Diabetes mellitus, type 2 Hemoglobin A1C of 7.8%. On metformin and glimepiride as an outpatient. -Continue SSI  Hyperlipidemia LDL of 44 -Continue Lipitor and fish oil   DVT prophylaxis: Lovenox Code Status:   Code Status: Full Code Family Communication: Mother at bedside Disposition Plan: Discharge pending cardiac workup   Consultants:   Cardiology  Procedures:   None  Antimicrobials:  None    Subjective: Feels bad today. No chest pain. Not feeling baseline. Anxious about cardiac cath.  Objective: Vitals:   02/03/18 2154 02/03/18 2203 02/04/18 0648 02/04/18 0948  BP:  119/80 124/88   Pulse:  88 92   Resp:      Temp:  (!) 97.5 F (36.4 C) 97.8 F (36.6 C)   TempSrc:  Oral Oral   SpO2: 98% 100% 100% 94%  Weight:   115.2 kg (254 lb)   Height:         Intake/Output Summary (Last 24 hours) at 02/04/2018 1005 Last data filed at 02/04/2018 0649 Gross per 24 hour  Intake 720 ml  Output 2150 ml  Net -1430 ml   Filed Weights   02/02/18 1837 02/02/18 2012 02/04/18 0648  Weight: 118.8 kg (262 lb) 117.8 kg (259 lb 11.2 oz) 115.2 kg (254 lb)    Examination:  General exam: Appears calm and comfortable Respiratory: Clear to auscultation bilaterally. Unlabored work of breathing. No wheezing or rales. Cardiovascular system: Regular rate and rhythm. Normal S1 and S2. No heart murmurs present. No extra heart sounds Gastrointestinal system: Abdomen is nondistended, soft and nontender. No organomegaly or masses felt. Normal bowel sounds heard. Central nervous system: Alert and oriented. No focal neurological deficits. Extremities: 1+ edema. No calf tenderness Skin: No cyanosis. No rashes Psychiatry: Judgement and insight appear normal. Mood & affect appropriate.     Data Reviewed: I have personally reviewed following labs and imaging studies  CBC: Recent Labs  Lab 02/02/18 1247 02/02/18 2341  WBC 14.0* 13.4*  NEUTROABS  --  8.0*  HGB 15.4 14.4  HCT 47.1 44.1  MCV 89.0 88.2  PLT 259 277   Basic Metabolic Panel: Recent Labs  Lab 02/02/18 1247 02/03/18 0459 02/04/18 0421  NA 139 141 136  K 3.5 3.7 3.1*  CL 107 103 101  CO2 21* 24 25  GLUCOSE 110* 114* 135*  BUN 31* 30* 31*  CREATININE 1.12 1.20 1.14  CALCIUM 9.3 9.3 8.6*  MG  --  1.7 1.7   GFR: Estimated Creatinine Clearance:  114.9 mL/min (by C-G formula based on SCr of 1.14 mg/dL). Liver Function Tests: Recent Labs  Lab 02/02/18 1339  AST 33  ALT 16*  ALKPHOS 52  BILITOT 1.5*  PROT 6.7  ALBUMIN 3.6   No results for input(s): LIPASE, AMYLASE in the last 168 hours. No results for input(s): AMMONIA in the last 168 hours. Coagulation Profile: No results for input(s): INR, PROTIME in the last 168 hours. Cardiac Enzymes: Recent Labs  Lab 02/02/18 1732  02/02/18 2341 02/03/18 0459  TROPONINI 0.20* 0.17* 0.24*   BNP (last 3 results) No results for input(s): PROBNP in the last 8760 hours. HbA1C: Recent Labs    02/02/18 2341  HGBA1C 7.8*   CBG: Recent Labs  Lab 02/03/18 0803 02/03/18 1107 02/03/18 1613 02/03/18 2158 02/04/18 0746  GLUCAP 115* 136* 199* 176* 149*   Lipid Profile: Recent Labs    02/03/18 0459  CHOL 95  HDL 33*  LDLCALC 44  TRIG 89  CHOLHDL 2.9   Thyroid Function Tests: Recent Labs    02/02/18 1342  TSH 3.207   Anemia Panel: No results for input(s): VITAMINB12, FOLATE, FERRITIN, TIBC, IRON, RETICCTPCT in the last 72 hours. Sepsis Labs: No results for input(s): PROCALCITON, LATICACIDVEN in the last 168 hours.  No results found for this or any previous visit (from the past 240 hour(s)).       Radiology Studies: Dg Chest 2 View  Result Date: 02/02/2018 CLINICAL DATA:  Shortness of breath, lower extremity and abdominal swelling, and 15 pound weight gain over the past 3 days. History of diabetes and asthma but no history of CHF. EXAM: CHEST - 2 VIEW COMPARISON:  None in PACs FINDINGS: The lungs are adequately inflated. There are bilateral pleural effusions greatest on the right. The lung markings are coarse in the left retrocardiac region. The cardiac silhouette is enlarged. The central pulmonary vascularity is engorged and there is mild cephalization. The mediastinum is normal in width. There is multilevel degenerative disc disease of the thoracic spine. IMPRESSION: CHF with mild interstitial edema and bilateral pleural effusions. Electronically Signed   By: David  [place] M.D.   On: 02/02/2018 13:07   Ct Angio Chest Pe W And/or Wo Contrast  Result Date: 02/02/2018 CLINICAL DATA:  Short of breath, lower extremity swelling for 5 days, positive D-dimer EXAM: CT ANGIOGRAPHY CHEST WITH CONTRAST TECHNIQUE: Multidetector CT imaging of the chest was performed using the standard protocol during bolus  administration of intravenous contrast. Multiplanar CT image reconstructions and MIPs were obtained to evaluate the vascular anatomy. CONTRAST:  ISOVUE-370 IOPAMIDOL (ISOVUE-370) INJECTION 76% COMPARISON:  Chest x-ray of 02/02/2018 FINDINGS: Cardiovascular: There is moderate cardiomegaly present there may be a small pericardial effusion present. There is good opacification of the pulmonary arteries. There is no evidence of acute pulmonary embolism. Diffuse coronary artery calcifications are noted primarily in the left anterior descending distribution. The mid ascending thoracic aorta measures 39 mm in diameter. Mediastinum/Nodes: No mediastinal or hilar adenopathy is noted. The thyroid gland is unremarkable although not well visualized. Esophagus is unremarkable. Lungs/Pleura: On lung window images, there are bilateral pleural effusions right larger than left with resultant bibasilar atelectasis posteriorly. Pneumonia would be difficult to exclude particularly involving the right lower lobe although this appearance most likely reflects atelectasis. No suspicious lung nodule is seen. Upper Abdomen: Images through the upper abdomen show no significant abnormality. There is some reflux of contrast into the hepatic veins. Musculoskeletal: The thoracic vertebrae are in normal alignment with moderate degenerative  spurring throughout the thoracic spine. No compression deformity is seen. Review of the MIP images confirms the above findings. IMPRESSION: 1. No evidence of acute pulmonary embolism. 2. Cardiomegaly, bilateral pleural effusions, right greater than left, findings most consistent with CHF. 3. Small pericardial effusion. 4. Coronary artery calcifications primarily within the left anterior descending artery. Electronically Signed   By: Dwyane Dee M.D.   On: 02/02/2018 15:45        Scheduled Meds: . [START ON 02/05/2018] aspirin  81 mg Oral Daily  . atorvastatin  40 mg Oral q1800  . carvedilol  3.125  mg Oral BID WC  . enoxaparin (LOVENOX) injection  40 mg Subcutaneous Q24H  . fluticasone  1 spray Each Nare Daily  . furosemide  40 mg Intravenous Q12H  . insulin aspart  0-15 Units Subcutaneous TID WC  . insulin aspart  0-5 Units Subcutaneous QHS  . losartan  100 mg Oral Daily  . mometasone-formoterol  2 puff Inhalation BID  . omega-3 acid ethyl esters  1 g Oral BID  . sertraline  100 mg Oral Daily  . sodium chloride flush  3 mL Intravenous Q12H  . sodium chloride flush  3 mL Intravenous Q12H   Continuous Infusions: . sodium chloride    . sodium chloride    . sodium chloride       LOS: 2 days     Jacquelin Hawking, MD [facility] 02/04/2018, 10:05 AM Pager: [phone]  If 7PM-7AM, please contact night-coverage [email or web address] Password [facility] 02/04/2018, 10:05 AM

## 2018-02-04 NOTE — Progress Notes (Signed)
TR BAND REMOVAL  LOCATION:    Radial rt radial  DEFLATED PER PROTOCOL:   yes  TIME BAND OFF / DRESSING APPLIED:    1415/gauze and tegaderm  SITE UPON ARRIVAL:    Level  0  SITE AFTER BAND REMOVAL:    Level 0  CIRCULATION SENSATION AND MOVEMENT:    Within Normal Limits : yes, rt hand and fingers warm and pink, sensation present  COMMENTS:

## 2018-02-04 NOTE — Brief Op Note (Signed)
Brief Cardiac Catheterization Note  Date: 02/04/2018 Time: 11:03 AM  PATIENT:  Curtis [name]  42 y.o. male  PRE-OPERATIVE DIAGNOSIS:  Acute systolic heart failure and elevated troponin  POST-OPERATIVE DIAGNOSIS:  Same  PROCEDURE:  Procedure(s): RIGHT/LEFT HEART CATH AND CORONARY ANGIOGRAPHY (N/A)  FINDINGS: 1. Multivessel coronary artery disease, including 50% proximal LAD, 80-90% mid LCx at takeoff of CTO'ed OM branch, and 60-70% distal RCA stenosis. 2. Moderately to severely elevated left heart, right heart, and pulmonary artery pressures. 3. Severely reduced Fick cardiac output/index.  RECOMMENDATIONS: 1. Degree of heart failure is out of proportion to coronary artery disease. I suspect that his underlying cardiomyopathy is nonischemic. 2. Advanced heart failure consultation with consideration for inotropic support to facilitate diuresis. 3. Aggressive secondary prevention.  Yvonne Kendall, MD [facility] Pager: [phone]

## 2018-02-04 NOTE — Interval H&P Note (Signed)
History and Physical Interval Note:  02/04/2018 9:38 AM  Curtis [name]  has presented today for cardiac catheterization, with the diagnosis of NSTEMI. The various methods of treatment have been discussed with the patient and family. After consideration of risks, benefits and other options for treatment, the patient has consented to  Procedure(s): RIGHT/LEFT HEART CATH AND CORONARY ANGIOGRAPHY (N/A) as a surgical intervention .  The patient's history has been reviewed, patient examined, no change in status, stable for surgery.  I have reviewed the patient's chart and labs.  Questions were answered to the patient's satisfaction.    Cath Lab Visit (complete for each Cath Lab visit)  Clinical Evaluation Leading to the Procedure:   ACS: Yes.    Non-ACS:  N/A   Derrico Zhong

## 2018-02-04 NOTE — Progress Notes (Signed)
Progress Note  Patient Name: Curtis [name] Date of Encounter: 02/04/2018  Primary Cardiologist: New to [facility] ( Will be followed by Dr. Swaziland (he is ok) after discharge)  Subjective   Breathing and edema has improved. No chest pain.   Inpatient Medications    Scheduled Meds: . aspirin  81 mg Oral Daily  . atorvastatin  40 mg Oral q1800  . carvedilol  3.125 mg Oral BID WC  . enoxaparin (LOVENOX) injection  40 mg Subcutaneous Q24H  . fluticasone  1 spray Each Nare Daily  . furosemide  40 mg Intravenous Q12H  . insulin aspart  0-15 Units Subcutaneous TID WC  . insulin aspart  0-5 Units Subcutaneous QHS  . losartan  100 mg Oral Daily  . mometasone-formoterol  2 puff Inhalation BID  . omega-3 acid ethyl esters  1 g Oral BID  . potassium chloride  40 mEq Oral Once  . sertraline  100 mg Oral Daily  . sodium chloride flush  3 mL Intravenous Q12H   Continuous Infusions: . sodium chloride     PRN Meds: sodium chloride, acetaminophen, albuterol, ondansetron (ZOFRAN) IV, sodium chloride flush, traZODone   Vital Signs    Vitals:   02/03/18 1553 02/03/18 2154 02/03/18 2203 02/04/18 0648  BP: 115/80  119/80 124/88  Pulse: 95  88 92  Resp:      Temp: 97.8 F (36.6 C)  (!) 97.5 F (36.4 C) 97.8 F (36.6 C)  TempSrc: Oral  Oral Oral  SpO2: 100% 98% 100% 100%  Weight:    254 lb (115.2 kg)  Height:        Intake/Output Summary (Last 24 hours) at 02/04/2018 0841 Last data filed at 02/04/2018 0649 Gross per 24 hour  Intake 720 ml  Output 2150 ml  Net -1430 ml   Filed Weights   02/02/18 1837 02/02/18 2012 02/04/18 0648  Weight: 262 lb (118.8 kg) 259 lb 11.2 oz (117.8 kg) 254 lb (115.2 kg)    Telemetry    SR with PVCs - Personally Reviewed  ECG    N/A  Physical Exam   GEN: No acute distress.   Neck: + JVD Cardiac: RRR, systolic  murmurs, rubs, or gallops.  Respiratory: Clear to auscultation bilaterally. GI: Soft, nontender, non-distended  MS: 2+ BL LE  edema;  No deformity. Neuro:  Nonfocal  Psych: Normal affect   Labs    Chemistry Recent Labs  Lab 02/02/18 1247 02/02/18 1339 02/03/18 0459 02/04/18 0421  NA 139  --  141 136  K 3.5  --  3.7 3.1*  CL 107  --  103 101  CO2 21*  --  24 25  GLUCOSE 110*  --  114* 135*  BUN 31*  --  30* 31*  CREATININE 1.12  --  1.20 1.14  CALCIUM 9.3  --  9.3 8.6*  PROT  --  6.7  --   --   ALBUMIN  --  3.6  --   --   AST  --  33  --   --   ALT  --  16*  --   --   ALKPHOS  --  52  --   --   BILITOT  --  1.5*  --   --   GFRNONAA >60  --  >60 >60  GFRAA >60  --  >60 >60  ANIONGAP 11  --  14 10     Hematology Recent Labs  Lab 02/02/18 1247 02/02/18 2341  WBC 14.0* 13.4*  RBC 5.29 5.00  HGB 15.4 14.4  HCT 47.1 44.1  MCV 89.0 88.2  MCH 29.1 28.8  MCHC 32.7 32.7  RDW 14.8 14.8  PLT 259 277    Cardiac Enzymes Recent Labs  Lab 02/02/18 1732 02/02/18 2341 02/03/18 0459  TROPONINI 0.20* 0.17* 0.24*    Recent Labs  Lab 02/02/18 1306  TROPIPOC 0.16*     BNP Recent Labs  Lab 02/02/18 1329  BNP 1,724.6*     DDimer  Recent Labs  Lab 02/02/18 1339  DDIMER 1.54*     Radiology    Dg Chest 2 View  Result Date: 02/02/2018 CLINICAL DATA:  Shortness of breath, lower extremity and abdominal swelling, and 15 pound weight gain over the past 3 days. History of diabetes and asthma but no history of CHF. EXAM: CHEST - 2 VIEW COMPARISON:  None in PACs FINDINGS: The lungs are adequately inflated. There are bilateral pleural effusions greatest on the right. The lung markings are coarse in the left retrocardiac region. The cardiac silhouette is enlarged. The central pulmonary vascularity is engorged and there is mild cephalization. The mediastinum is normal in width. There is multilevel degenerative disc disease of the thoracic spine. IMPRESSION: CHF with mild interstitial edema and bilateral pleural effusions. Electronically Signed   By: David  Sw[place]iland M.D.   On: 02/02/2018 13:07   Ct Angio  Chest Pe W And/or Wo Contrast  Result Date: 02/02/2018 CLINICAL DATA:  Short of breath, lower extremity swelling for 5 days, positive D-dimer EXAM: CT ANGIOGRAPHY CHEST WITH CONTRAST TECHNIQUE: Multidetector CT imaging of the chest was performed using the standard protocol during bolus administration of intravenous contrast. Multiplanar CT image reconstructions and MIPs were obtained to evaluate the vascular anatomy. CONTRAST:  ISOVUE-370 IOPAMIDOL (ISOVUE-370) INJECTION 76% COMPARISON:  Chest x-ray of 02/02/2018 FINDINGS: Cardiovascular: There is moderate cardiomegaly present there may be a small pericardial effusion present. There is good opacification of the pulmonary arteries. There is no evidence of acute pulmonary embolism. Diffuse coronary artery calcifications are noted primarily in the left anterior descending distribution. The mid ascending thoracic aorta measures 39 mm in diameter. Mediastinum/Nodes: No mediastinal or hilar adenopathy is noted. The thyroid gland is unremarkable although not well visualized. Esophagus is unremarkable. Lungs/Pleura: On lung window images, there are bilateral pleural effusions right larger than left with resultant bibasilar atelectasis posteriorly. Pneumonia would be difficult to exclude particularly involving the right lower lobe although this appearance most likely reflects atelectasis. No suspicious lung nodule is seen. Upper Abdomen: Images through the upper abdomen show no significant abnormality. There is some reflux of contrast into the hepatic veins. Musculoskeletal: The thoracic vertebrae are in normal alignment with moderate degenerative spurring throughout the thoracic spine. No compression deformity is seen. Review of the MIP images confirms the above findings. IMPRESSION: 1. No evidence of acute pulmonary embolism. 2. Cardiomegaly, bilateral pleural effusions, right greater than left, findings most consistent with CHF. 3. Small pericardial effusion. 4.  Coronary artery calcifications primarily within the left anterior descending artery. Electronically Signed   By: Dwyane Dee M.D.   On: 02/02/2018 15:45    Cardiac Studies   Echo 02/03/18 Study Conclusions  - Left ventricle: The cavity size was severely dilated. Systolic   function was severely reduced. The estimated ejection fraction   was in the range of 15% to 20%. Doppler parameters are consistent   with abnormal left ventricular relaxation (grade 1 diastolic   dysfunction). - Aortic valve: Bicuspid. There  was moderate regurgitation. Valve   area (VTI): 0.38 cm^2. Valve area (Vmax): 0.7 cm^2. Valve area   (Vmean): 0.76 cm^2. - Left atrium: The atrium was moderately dilated. - Right ventricle: Systolic function was moderately reduced. - Right atrium: The atrium was severely dilated. - Tricuspid valve: There was mild-moderate regurgitation. - Pulmonary arteries: Systolic pressure was mildly increased. PA   peak pressure: 33 mm Hg (S). - Pericardium, extracardiac: A small pericardial effusion was   identified. There was no evidence of hemodynamic compromise.   Features were not consistent with tamponade physiology.  Impressions:  - The LV is markedly dilated and has severe reduction of LV   function . EF 15-20%.   grade 1 diastolic dysfunction   Bicuspid AV with moderate AI  Patient Profile     42 y.o. male with DM2, HTN, dyslipidemia and family history of CAD (parents are patient of Dr. Swaziland) presents with SOB, LE edema, orthopnea and weight gain (15 lb). He reports running out of his hypertension meds in February    Assessment & Plan    1. Acute combined CHF - BNP 1724. Echo showed markedly dilated LV with EF of 15-20%. Frade 1 diastolic dysfunction   Bicuspid AV with moderate AI. - Net I & O 2.9L. Weight down 5 LB ( 259-->254lb).  - Given LV dysfunction, NSTEMI and valvular heart disease, will get R & L cath today.   2. NSTEMI - troponin of 0.2->0.17->0.24. No  change pain. Noted coronary calcification (in LAD) on CT angio of chest. EKG with non specific TWI.  Continue ASA and statin.  - Cath as above.  3. Valvular heart disease - Echo showed Bicuspid AV with moderate AI. Cath today.  4. Hypertensive heart disease - He run out of antihypertensive prior to presentation. BB stable today.   5. PVCS.  - will supplement K today. Continue BB.   6. HLD - 02/03/2018: Cholesterol 95; HDL 33; LDL Cholesterol 44; Triglycerides 89; VLDL 18  - Continue statin   7. DM - per primary team  For questions or updates, please contact [facility] Please consult [email or web address] for contact info under Cardiology/STEMI.      Lorelei Pont, PA  02/04/2018, 8:41 AM

## 2018-02-04 NOTE — Progress Notes (Signed)
Peripherally Inserted Central Catheter/Midline Placement  The IV Nurse has discussed with the patient and/or persons authorized to consent for the patient, the purpose of this procedure and the potential benefits and risks involved with this procedure.  The benefits include less needle sticks, lab draws from the catheter, and the patient may be discharged home with the catheter. Risks include, but not limited to, infection, bleeding, blood clot (thrombus formation), and puncture of an artery; nerve damage and irregular heartbeat and possibility to perform a PICC exchange if needed/ordered by physician.  Alternatives to this procedure were also discussed.  Bard Power PICC patient education guide, fact sheet on infection prevention and patient information card has been provided to patient /or left at bedside.    PICC/Midline Placement Documentation  PICC Triple Lumen 02/04/18 PICC Right Basilic 46 cm 1 cm (Active)  Indication for Insertion or Continuance of Line Poor Vasculature-patient has had multiple peripheral attempts or PIVs lasting less than 24 hours 02/04/2018  1:20 PM  Exposed Catheter (cm) 1 cm 02/04/2018  1:20 PM  Site Assessment Clean;Dry;Intact 02/04/2018  1:20 PM  Lumen #1 Status Flushed;Blood return noted;Saline locked 02/04/2018  1:20 PM  Lumen #2 Status Flushed;Blood return noted;Saline locked 02/04/2018  1:20 PM  Lumen #3 Status Flushed;Blood return noted;Saline locked 02/04/2018  1:20 PM  Dressing Type Transparent 02/04/2018  1:20 PM  Dressing Status Dry;Clean;Antimicrobial disc in place;Intact 02/04/2018  1:20 PM  Dressing Change Due 02/11/18 02/04/2018  1:20 PM       Curtis [name] 02/04/2018, 1:23 PM

## 2018-02-04 NOTE — Plan of Care (Signed)
  Pain Managment: General experience of comfort will improve 02/04/2018 0051 - Completed/Met by Theora Gianotti, RN  Pt denies pain. Displays no signs or symptoms of distress.

## 2018-02-04 NOTE — Progress Notes (Signed)
Milrinone switched from left ac to white port of PIC line rt upper arm

## 2018-02-04 NOTE — Consult Note (Addendum)
Advanced Heart Failure Team Consult Note   Primary Physician: Gweneth Dimitri, MD Primary Cardiologist:  No primary care provider on file.  Reason for Consultation: acute systolic HF  HPI:    Curtis [name] is seen today for evaluation of acute systolic HF at the request of Dr Rennis Golden.  Curtis [name] is a 42 y.o. male with a history of HTN, asthma, HL, and DM.   He was in his usual state of health in January and was able to ride 30-40 miles on his bicycle. He was out of losartan/hctz for 1 week during Jan due to the recall and remembers feeling badly until it was restarted. In February, he felt SOB and had a productive cough with clear sputum but thought it was asthma. His PCP started him on a maintenance inhaler and his symptoms resolved. He recalls having some nausea with vomiting mid-February. No fever, diarrhea, or chills.   His symptoms worsened last weekend with SOB on exertion and 20 lb weight gain (over 1 week). He also endorses orthopnea, PND, fatigue, and dizziness. No CP, fever, or chills. He still has productive cough with clear sputum.   He saw his PCP on 3/19, who sent him to [facility] and he was admitted for evaluation and treatment of fluid overload.  He has diuresed 8 lbs with 40 mg IV lasix BID.  Echo yesterday with EF 15-20% with severe LV dilation and grade 1 DD, mod reduced RV function.  R/LHC today with multivessel disease, elevated filling pressures, and severely reduced CO/CI (see below). Transferred to ICU for management of acute systolic HF.   Pertinent admission labs include: Troponin 0.20, Na 141, K 3.7, creatinine 1.20, LDL 44, WBC 13.4, hemoglobin 14.4, A1C 7.8, BNP 1725, UDS negative, TSH 3.2 CXR: mild interstitial edema and bilateral pleural effusions.   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 His parents live in [place]. No issues with transportation or getting medications. Manages own medications. No tobacco, or drugs. Drinks 1 beer/  month. Significant family history of CAD and CHF. Father had a CABG at 46.   CTA Chest 02/02/2018: 1. No evidence of acute pulmonary embolism. 2. Cardiomegaly, bilateral pleural effusions, right greater than left, findings most consistent with CHF. 3. Small pericardial effusion. 4. Coronary artery calcifications primarily within the left anterior descending artery.  R/LHC 02/04/2018: CO 2.92, CI 1.19, RA 16, PA 55//32 (42), PW mean 26, PVR 5.48, SVR 2027, PAPI: 1.44, RA/PAWP: 0.62 1. Multivessel coronary artery disease, including 50% proximal LAD, 80-90% mid LCx at takeoff of CTO'ed OM branch, and 60-70% distal RCA stenosis. 2. Moderately to severely elevated left heart, right heart, and pulmonary artery pressures. 3. Severely reduced Fick cardiac output/index.  Echo 02/03/2018 - Left ventricle: The cavity size was severely dilated. Systolic   function was severely reduced. The estimated ejection fraction   was in the range of 15% to 20%. Doppler parameters are consistent   with abnormal left ventricular relaxation (grade 1 diastolic   dysfunction). - Aortic valve: Bicuspid. There was moderate regurgitation. Valve   area (VTI): 0.38 cm^2. Valve area (Vmax): 0.7 cm^2. Valve area   (Vmean): 0.76 cm^2. - Left atrium: The atrium was moderately dilated. - Right ventricle: Systolic function was moderately reduced. - Right atrium: The atrium was severely dilated. - Tricuspid valve: There was mild-moderate regurgitation. - Pulmonary arteries: Systolic pressure was mildly increased. PA   peak pressure: 33 mm Hg (S). - Pericardium, extracardiac: A small pericardial effusion was  identified. There was no evidence of hemodynamic compromise.   Features were not consistent with tamponade physiology.  Review of Systems: [y] = yes, [ ]  = no   General: Weight gain [ y]; Weight loss [ ] ; Anorexia [ ] ; Fatigue [ ] ; Fever [ ] ; Chills [ ] ; Weakness [ ]   Cardiac: Chest pain/pressure [ ] ; Resting SOB [ ] ;  Exertional SOB Cove.Etienne ]; Orthopnea Cove.Etienne ]; Pedal Edema Cove.Etienne ]; Palpitations [ ] ; Syncope [ ] ; Presyncope [ ] ; Paroxysmal nocturnal dyspnea[y ]  Pulmonary: Cough [ ] ; Wheezing[ ] ; Hemoptysis[ ] ; Sputum [ ] ; Snoring [ ]   GI: Vomiting[ y]; Dysphagia[ ] ; Melena[ ] ; Hematochezia [ ] ; Heartburn[ ] ; Abdominal pain [ ] ; Constipation [ ] ; Diarrhea [ ] ; BRBPR [ ]   GU: Hematuria[ ] ; Dysuria [ ] ; Nocturia[ ]   Vascular: Pain in legs with walking [ ] ; Pain in feet with lying flat [ ] ; Non-healing sores [ ] ; Stroke [ ] ; TIA [ ] ; Slurred speech [ ] ;  Neuro: Headaches[ ] ; Vertigo[ ] ; Seizures[ ] ; Paresthesias[ ] ;Blurred vision [ ] ; Diplopia [ ] ; Vision changes [ ]   Ortho/Skin: Arthritis [ ] ; Joint pain [ ] ; Muscle pain [ ] ; Joint swelling [ ] ; Back Pain [ ] ; Rash [ ]   Psych: Depression[ ] ; Anxiety[ ]   Heme: Bleeding problems [ ] ; Clotting disorders [ ] ; Anemia [ ]   Endocrine: Diabetes [ y]; Thyroid dysfunction[ ]   Home Medications Prior to Admission medications   Medication Sig Start Date End Date Taking? Authorizing Provider  albuterol (PROVENTIL HFA;VENTOLIN HFA) 108 (90 Base) MCG/ACT inhaler Inhale 2 puffs into the lungs every 6 (six) hours as needed for wheezing or shortness of breath.   Yes [provider]  budesonide-formoterol (SYMBICORT) 160-4.5 MCG/ACT inhaler Inhale 2 puffs into the lungs 2 (two) times daily.   Yes [provider]  Cholecalciferol (VITAMIN D3) 5000 units CAPS Take 5,000 Units by mouth daily.   Yes [provider]  fluticasone (FLONASE) 50 MCG/ACT nasal spray Place 1 spray into both nostrils daily.   Yes [provider]  glimepiride (AMARYL) 2 MG tablet Take 2 mg by mouth daily with breakfast.   Yes [provider]  hydrochlorothiazide (HYDRODIURIL) 25 MG tablet Take 25 mg by mouth daily.   Yes [provider]  losartan (COZAAR) 100 MG tablet Take 100 mg by mouth daily.   Yes [provider]  METFORMIN HCL ER PO Take 1,000 mg by  mouth 2 (two) times daily.    Yes [provider]  Multiple Vitamin (MULTIVITAMIN) tablet Take 1 tablet by mouth daily.   Yes [provider]  omega-3 acid ethyl esters (LOVAZA) 1 g capsule Take 1 g by mouth 2 (two) times daily.   Yes [provider]  sertraline (ZOLOFT) 100 MG tablet Take 100 mg by mouth daily.   Yes [provider]  simvastatin (ZOCOR) 20 MG tablet Take 20 mg by mouth daily at 6 PM.   Yes [provider]    Past Medical History: Past Medical History:  Diagnosis Date  . Asthma   . Diabetes mellitus without complication (HCC)   . Hyperlipidemia   . Hypertension     Past Surgical History: Past Surgical History:  Procedure Laterality Date  . ABSCESS DRAINAGE     MRSA inside nares  . APPENDECTOMY      Family History: Family History  Problem Relation Age of Onset  . Mitral valve prolapse Mother   . Supraventricular tachycardia Mother   .  CAD Father 16       CABG, stent  . Hypertension Father   . Heart failure Maternal Grandfather   . Congestive Heart Failure Paternal Uncle        has pacer, defibrillator  . CAD Paternal Grandfather   . CAD Maternal Grandmother   . CAD Maternal Uncle     Social History: Social History   Socioeconomic History  . Marital status: Single    Spouse name: Not on file  . Number of children: Not on file  . Years of education: Not on file  . Highest education level: Not on file  Occupational History  . Occupation: Curator  Social Needs  . Financial resource strain: Not on file  . Food insecurity:    Worry: Not on file    Inability: Not on file  . Transportation needs:    Medical: Not on file    Non-medical: Not on file  Tobacco Use  . Smoking status: Never Smoker  . Smokeless tobacco: Never Used  Substance and Sexual Activity  . Alcohol use: No    Frequency: Never    Comment: remote occasional use  . Drug use: No    Comment: remote use of "weed edibles", more heavy  abuse in college  . Sexual activity: Never    Partners: Female    Comment: last 2 years ago  Lifestyle  . Physical activity:    Days per week: Not on file    Minutes per session: Not on file  . Stress: Not on file  Relationships  . Social connections:    Talks on phone: Not on file    Gets together: Not on file    Attends religious service: Not on file    Active member of club or organization: Not on file    Attends meetings of clubs or organizations: Not on file    Relationship status: Not on file  Other Topics Concern  . Not on file  Social History Narrative  . Not on file    Allergies:  No Known Allergies  Objective:    Vital Signs:   Temp:  [97.5 F (36.4 C)-97.8 F (36.6 C)] 97.8 F (36.6 C) (03/21 0648) Pulse Rate:  [88-95] 92 (03/21 0648) BP: (115-124)/(80-88) 124/88 (03/21 0648) SpO2:  [94 %-100 %] 94 % (03/21 0948) Weight:  [254 lb (115.2 kg)] 254 lb (115.2 kg) (03/21 0648) Last BM Date: 02/02/18  Weight change: Filed Weights   02/02/18 1837 02/02/18 2012 02/04/18 0648  Weight: 262 lb (118.8 kg) 259 lb 11.2 oz (117.8 kg) 254 lb (115.2 kg)    Intake/Output:   Intake/Output Summary (Last 24 hours) at 02/04/2018 1018 Last data filed at 02/04/2018 0649 Gross per 24 hour  Intake 720 ml  Output 2150 ml  Net -1430 ml      Physical Exam    General:  Tall. No resp difficulty HEENT: normal Neck: supple. JVP elevated. Carotids 2+ bilat; no bruits. No lymphadenopathy or thyromegaly appreciated. Cor: PMI nondisplaced. Regular rate & rhythm. + S3 Lungs: fine crackles in bases Abdomen: soft, nontender, +distension. No hepatosplenomegaly. No bruits or masses. Good bowel sounds. Extremities: no cyanosis, clubbing, rash, BLE edema +3-4, warm Neuro: alert & orientedx3, cranial nerves grossly intact. moves all 4 extremities w/o difficulty. Affect pleasant   Telemetry   NSR 90's. Personally reviewed.   EKG    EKG 02/03/18: SR with PAC's and IVCD. 98 bpm.  Personally reviewed.   Labs   Basic Metabolic Panel:  Recent Labs  Lab 02/02/18 1247 02/03/18 0459 02/04/18 0421  NA 139 141 136  K 3.5 3.7 3.1*  CL 107 103 101  CO2 21* 24 25  GLUCOSE 110* 114* 135*  BUN 31* 30* 31*  CREATININE 1.12 1.20 1.14  CALCIUM 9.3 9.3 8.6*  MG  --  1.7 1.7    Liver Function Tests: Recent Labs  Lab 02/02/18 1339  AST 33  ALT 16*  ALKPHOS 52  BILITOT 1.5*  PROT 6.7  ALBUMIN 3.6   No results for input(s): LIPASE, AMYLASE in the last 168 hours. No results for input(s): AMMONIA in the last 168 hours.  CBC: Recent Labs  Lab 02/02/18 1247 02/02/18 2341  WBC 14.0* 13.4*  NEUTROABS  --  8.0*  HGB 15.4 14.4  HCT 47.1 44.1  MCV 89.0 88.2  PLT 259 277    Cardiac Enzymes: Recent Labs  Lab 02/02/18 1732 02/02/18 2341 02/03/18 0459  TROPONINI 0.20* 0.17* 0.24*    BNP: BNP (last 3 results) Recent Labs    02/02/18 1329  BNP 1,724.6*    ProBNP (last 3 results) No results for input(s): PROBNP in the last 8760 hours.   CBG: Recent Labs  Lab 02/03/18 0803 02/03/18 1107 02/03/18 1613 02/03/18 2158 02/04/18 0746  GLUCAP 115* 136* 199* 176* 149*    Coagulation Studies: No results for input(s): LABPROT, INR in the last 72 hours.   Imaging    No results found.   Medications:     Current Medications: . [START ON 02/05/2018] aspirin  81 mg Oral Daily  . atorvastatin  40 mg Oral q1800  . carvedilol  3.125 mg Oral BID WC  . enoxaparin (LOVENOX) injection  40 mg Subcutaneous Q24H  . fluticasone  1 spray Each Nare Daily  . furosemide  40 mg Intravenous Q12H  . insulin aspart  0-15 Units Subcutaneous TID WC  . insulin aspart  0-5 Units Subcutaneous QHS  . losartan  100 mg Oral Daily  . mometasone-formoterol  2 puff Inhalation BID  . omega-3 acid ethyl esters  1 g Oral BID  . sertraline  100 mg Oral Daily  . sodium chloride flush  3 mL Intravenous Q12H  . sodium chloride flush  3 mL Intravenous Q12H     Infusions: .  sodium chloride    . sodium chloride    . sodium chloride         Patient Profile   Curtis [name] is a 42 y.o. male with a history of HTN, asthma, HL, and DM.   Admitted for evaluation and treatment of fluid overload.   Assessment/Plan    1. Acute systolic HF Echo 1/61/0960 EF 45-40% with severe LV dilation and grade 1 DD, bicuspid aortic valve with mod AI, mod LA dilation, mod reduction in RV function, severe dilation RA, mild-mod TR, PA peak pressure 33 mmHg, and small pericardial effusion. HIV negative. No significant hx of ETOH or drug use. TSH normal. Family hx of CHF. ?viral vs familial etiology.  - R/LHC 3/21 with multivessel CAD, CO 2.92, CI 1.19, RA 16, PA 55/32 (42), PW mean 26, PVR 5.48, SVR 2027, PAPI: 1.44, RA/PAWP: 0.62 - Volume status elevated on RHC and exam.  - Continue IV lasix 60 mg BID - Start milrinone 0.25 mcg/kg/min  - Start digoxin 0.125 mg daily - DC coreg with low output - Transition to Entresto 24/26 bid (stopping losartan) tomorrow if BP allows.  - Consider adding spiro 12.5 mg daily tomorrow if BP  allows - Insert PICC to monitor co-ox and CVP - Will likely need cardiac MRI this admission - Will check blood type for consideration of possible heart transplant given age and severity of HF.   2. CAD LHC 02/04/18: Multivessel coronary artery disease, including 50% proximal LAD, 80% mid LCx at takeoff of CTO'ed OM branch, and 60-70% distal RCA stenosis. - Troponin 0.20 > 0.17 > 0.24. Likely demand ischemia.  - Continue statin, ASA  3. DM - Continue SSI per primary. A1C 7.8  4. HTN - Will manage with HF meds. SBP 100-130's.   5. HL - LDL 44 on 3/20 - Continue statin  6. Hypokalemia - K 3.1 this am. Received 40 meq. Will order additional 80 meq. - Will also supplement mag 1.7 with 4 grams IV mag  Medication concerns reviewed with patient and pharmacy team. Barriers identified: None  Length of Stay: 2  Alford Highland, NP 02/04/18, 10:18  AM  Advanced Heart Failure Team Pager [phone] (M-F; 7a - 4p)  Please contact [facility] for night-coverage after hours (4p -7a ) and weekends on [email or web address]  Patient see with NP, agree with the above note.    Patient has a history of HTN and DM, strong family history of CAD.  He has been feeling bad for about 2 months, initially thought his asthma was "acting up."  Some dyspnea, but not severe.  However, over the last week, he has had marked exertional dyspnea, weight gain and orthopnea.  He was admitted with these symptoms.  No chest pain.   Echo was done which I reviewed.  EF 15-20% with moderate LV dilation, mildly dilated RV with moderately decreased RV systolic function. The patient also has a bicuspid aortic valve with moderate AI.  The ascending aorta is dilated to 3.9 cm.   LHC/RHC done today and I reviewed.  Coronaries as described above.  He has extensive moderate but not critical CAD.  I would not expect this to have caused his EF to fall so markedly. On RHC, filling pressures were elevated with markedly low cardiac output and elevated SVR.  PAPi was low at 1.4 and CVP/PCWP high at 0.62 suggesting significant co-existing RV dysfunction.   He has had PICC placed.   On exam, JVP 14 cm, heart regular with no S3.  No peripheral edema.    1. Acute on chronic systolic CHF: Echo 1/19 with EF 15-20%, moderate LV dilation, mildly dilated RV with moderately decreased RV systolic function.  He had coronary angiography this admission with moderate coronary disease, but it does not explain low EF.  Possible viral myocarditis.  He had low output on RHC also with evidence for significant RV failure.  On exam, he remains volume overloaded.  BP not low.   - PICC line placed.  Starting milrinone 0.25 mcg/kg/min and will follow CVP/co-ox.  - Start digoxin 0.125.  - Diurese with Lasix 60 mg IV bid.  - With stable BP and already on losartan 100 mg daily, will stop losartan and start on Entresto 24/26  bid in the morning.  - Hold Coreg for now with low output HF.  - Will eventually start spironolactone.  - He will need cardiac MRI when stable.  - May end up needing consideration of advanced therapies (LVAD, transplant).  RV function may be limiting.  2. CAD: Has strong family history of CAD.  On cath today he had extensive moderate CAD, but it does not explain his cardiomyopathy.  Most significant appears to  be an 80% stenosis in the mid LCx.  Medical management for the time being.  - Continue ASA 81 and statin.  3. Bicuspid aortic valve disorder: He has moderate aortic insufficiency with a bicuspid aortic valve.  3.9 cm ascending aorta.   4. Type II diabetes: Insulin in hospital.   Marca Ancona 02/04/2018

## 2018-02-05 ENCOUNTER — Encounter (HOSPITAL_COMMUNITY): Payer: Self-pay | Admitting: Internal Medicine

## 2018-02-05 LAB — POCT I-STAT 3, ART BLOOD GAS (G3+)
ACID-BASE DEFICIT: 2 mmol/L (ref 0.0–2.0)
Bicarbonate: 19 mmol/L — ABNORMAL LOW (ref 20.0–28.0)
O2 SAT: 99 %
PH ART: 7.512 — AB (ref 7.350–7.450)
TCO2: 20 mmol/L — AB (ref 22–32)
pCO2 arterial: 23.7 mmHg — ABNORMAL LOW (ref 32.0–48.0)
pO2, Arterial: 114 mmHg — ABNORMAL HIGH (ref 83.0–108.0)

## 2018-02-05 LAB — COOXEMETRY PANEL
CARBOXYHEMOGLOBIN: 1 % (ref 0.5–1.5)
Carboxyhemoglobin: 0.9 % (ref 0.5–1.5)
Carboxyhemoglobin: 1.1 % (ref 0.5–1.5)
Carboxyhemoglobin: 1.4 % (ref 0.5–1.5)
METHEMOGLOBIN: 1.3 % (ref 0.0–1.5)
Methemoglobin: 1.3 % (ref 0.0–1.5)
Methemoglobin: 1.4 % (ref 0.0–1.5)
Methemoglobin: 0.9 % (ref 0.0–1.5)
O2 SAT: 50.3 %
O2 SAT: 33.2 %
O2 SAT: 54.9 %
O2 Saturation: 48.7 %
TOTAL HEMOGLOBIN: 13.3 g/dL (ref 12.0–16.0)
Total hemoglobin: 12.9 g/dL (ref 12.0–16.0)
Total hemoglobin: 13.4 g/dL (ref 12.0–16.0)
Total hemoglobin: 13.5 g/dL (ref 12.0–16.0)

## 2018-02-05 LAB — BASIC METABOLIC PANEL
ANION GAP: 12 (ref 5–15)
BUN: 29 mg/dL — ABNORMAL HIGH (ref 6–20)
CALCIUM: 8.3 mg/dL — AB (ref 8.9–10.3)
CO2: 22 mmol/L (ref 22–32)
Chloride: 99 mmol/L — ABNORMAL LOW (ref 101–111)
Creatinine, Ser: 1.01 mg/dL (ref 0.61–1.24)
Glucose, Bld: 175 mg/dL — ABNORMAL HIGH (ref 65–99)
Potassium: 3.7 mmol/L (ref 3.5–5.1)
SODIUM: 133 mmol/L — AB (ref 135–145)

## 2018-02-05 LAB — POCT I-STAT 3, VENOUS BLOOD GAS (G3P V)
ACID-BASE DEFICIT: 1 mmol/L (ref 0.0–2.0)
ACID-BASE DEFICIT: 2 mmol/L (ref 0.0–2.0)
BICARBONATE: 23.1 mmol/L (ref 20.0–28.0)
BICARBONATE: 23.1 mmol/L (ref 20.0–28.0)
O2 SAT: 43 %
O2 Saturation: 40 %
PH VEN: 7.396 (ref 7.250–7.430)
TCO2: 24 mmol/L (ref 22–32)
TCO2: 24 mmol/L (ref 22–32)
pCO2, Ven: 37.6 mmHg — ABNORMAL LOW (ref 44.0–60.0)
pCO2, Ven: 38.3 mmHg — ABNORMAL LOW (ref 44.0–60.0)
pH, Ven: 7.388 (ref 7.250–7.430)
pO2, Ven: 23 mmHg — CL (ref 32.0–45.0)
pO2, Ven: 24 mmHg — CL (ref 32.0–45.0)

## 2018-02-05 LAB — MAGNESIUM: Magnesium: 2.1 mg/dL (ref 1.7–2.4)

## 2018-02-05 LAB — GLUCOSE, CAPILLARY
GLUCOSE-CAPILLARY: 160 mg/dL — AB (ref 65–99)
GLUCOSE-CAPILLARY: 202 mg/dL — AB (ref 65–99)
GLUCOSE-CAPILLARY: 155 mg/dL — AB (ref 65–99)
Glucose-Capillary: 134 mg/dL — ABNORMAL HIGH (ref 65–99)

## 2018-02-05 MED ORDER — FUROSEMIDE 10 MG/ML IJ SOLN
80.0000 mg | Freq: Two times a day (BID) | INTRAMUSCULAR | Status: DC
  Administered 2018-02-05 – 2018-02-06 (×3): 80 mg via INTRAVENOUS
  Filled 2018-02-05 (×3): qty 8

## 2018-02-05 MED ORDER — AMIODARONE HCL IN DEXTROSE 360-4.14 MG/200ML-% IV SOLN
60.0000 mg/h | INTRAVENOUS | Status: AC
  Administered 2018-02-05 (×2): 60 mg/h via INTRAVENOUS
  Filled 2018-02-05: qty 200

## 2018-02-05 MED ORDER — INSULIN ASPART 100 UNIT/ML ~~LOC~~ SOLN
0.0000 [IU] | Freq: Three times a day (TID) | SUBCUTANEOUS | Status: DC
  Administered 2018-02-05: 4 [IU] via SUBCUTANEOUS
  Administered 2018-02-05: 7 [IU] via SUBCUTANEOUS
  Administered 2018-02-06: 3 [IU] via SUBCUTANEOUS
  Administered 2018-02-06: 4 [IU] via SUBCUTANEOUS
  Administered 2018-02-06: 3 [IU] via SUBCUTANEOUS
  Administered 2018-02-07: 4 [IU] via SUBCUTANEOUS
  Administered 2018-02-07 (×2): 3 [IU] via SUBCUTANEOUS
  Administered 2018-02-08: 11 [IU] via SUBCUTANEOUS
  Administered 2018-02-08 – 2018-02-09 (×2): 3 [IU] via SUBCUTANEOUS
  Administered 2018-02-09: 7 [IU] via SUBCUTANEOUS
  Administered 2018-02-09: 3 [IU] via SUBCUTANEOUS
  Administered 2018-02-10: 4 [IU] via SUBCUTANEOUS
  Administered 2018-02-10 – 2018-02-11 (×2): 3 [IU] via SUBCUTANEOUS
  Administered 2018-02-11: 7 [IU] via SUBCUTANEOUS
  Administered 2018-02-11 – 2018-02-12 (×2): 3 [IU] via SUBCUTANEOUS

## 2018-02-05 MED ORDER — ENOXAPARIN SODIUM 60 MG/0.6ML ~~LOC~~ SOLN
60.0000 mg | SUBCUTANEOUS | Status: DC
  Administered 2018-02-05 – 2018-02-06 (×2): 60 mg via SUBCUTANEOUS
  Filled 2018-02-05 (×3): qty 0.6

## 2018-02-05 MED ORDER — POTASSIUM CHLORIDE CRYS ER 20 MEQ PO TBCR
40.0000 meq | EXTENDED_RELEASE_TABLET | Freq: Once | ORAL | Status: AC
  Administered 2018-02-05: 40 meq via ORAL
  Filled 2018-02-05: qty 2

## 2018-02-05 MED ORDER — POTASSIUM CHLORIDE CRYS ER 20 MEQ PO TBCR
20.0000 meq | EXTENDED_RELEASE_TABLET | Freq: Two times a day (BID) | ORAL | Status: DC
  Administered 2018-02-05: 20 meq via ORAL
  Filled 2018-02-05 (×3): qty 1

## 2018-02-05 MED ORDER — SPIRONOLACTONE 12.5 MG HALF TABLET
12.5000 mg | ORAL_TABLET | Freq: Every day | ORAL | Status: DC
  Administered 2018-02-05 – 2018-02-06 (×2): 12.5 mg via ORAL
  Filled 2018-02-05 (×3): qty 1

## 2018-02-05 MED ORDER — ALPRAZOLAM 0.25 MG PO TABS
0.2500 mg | ORAL_TABLET | Freq: Three times a day (TID) | ORAL | Status: DC | PRN
Start: 2018-02-05 — End: 2018-02-12
  Administered 2018-02-05 – 2018-02-08 (×2): 0.25 mg via ORAL
  Filled 2018-02-05 (×2): qty 1

## 2018-02-05 MED ORDER — AMIODARONE HCL IN DEXTROSE 360-4.14 MG/200ML-% IV SOLN
INTRAVENOUS | Status: AC
  Filled 2018-02-05: qty 200

## 2018-02-05 MED ORDER — METOLAZONE 2.5 MG PO TABS
2.5000 mg | ORAL_TABLET | Freq: Once | ORAL | Status: AC
  Administered 2018-02-05: 2.5 mg via ORAL
  Filled 2018-02-05: qty 1

## 2018-02-05 MED ORDER — AMIODARONE HCL IN DEXTROSE 360-4.14 MG/200ML-% IV SOLN
30.0000 mg/h | INTRAVENOUS | Status: DC
  Administered 2018-02-06 – 2018-02-10 (×10): 30 mg/h via INTRAVENOUS
  Filled 2018-02-05 (×12): qty 200

## 2018-02-05 MED FILL — Heparin Sodium (Porcine) 2 Unit/ML in Sodium Chloride 0.9%: INTRAMUSCULAR | Qty: 1000 | Status: AC

## 2018-02-05 MED FILL — Lidocaine HCl Local Inj 1%: INTRAMUSCULAR | Qty: 20 | Status: AC

## 2018-02-05 NOTE — Progress Notes (Signed)
Recheck of AM COOx 33%, orders to increase Milrinone to .375. RN will recheck COOx at 1200

## 2018-02-05 NOTE — Progress Notes (Signed)
CARDIAC REHAB PHASE I   1445-1530 HF teaching began.  Reviewed HF booklet information with pt and pt's father.  Pt with good understanding of heart healthy diet and limiting salt intake. Reviewed HF Zones with patient and importance of daily weights. Verbalized understanding.  Heart healthy and diabetic diet handouts given to pt.  Declined walk at this time.  To walk in halls later.   Nikki Dom, RN 02/05/2018 3:25 PM

## 2018-02-05 NOTE — Progress Notes (Signed)
Pt walked the entire ICU, 362ft without complaints. RN observed mild SOB, pt denies concern. Pt states that he "feels good".  HR 107, sat 98% on RA. RN will continue to monitor.

## 2018-02-05 NOTE — Progress Notes (Signed)
   On milrinone 0.375 mcg. Repeat CO-OX 49%  Discussed with Dr Shirlee Latch. Increase milrinone to 0.5 mcg.   Dessie Delcarlo NP-C  3:31 PM

## 2018-02-05 NOTE — Care Management Note (Signed)
Case Management Note Donn Pierini RN, BSN Unit 4E-Case Manager-- 2H coverage [phone]  Patient Details  Name: Curtis [name] MRN: [id number] Date of Birth: 11-04-1976  Subjective/Objective:  Pt admitted with CHF                  Action/Plan: PTA pt lived at home, referral for [facility] received- attempted to complete insurance check- however unable to verify pt's drug coverage- spoke with pt at bedside- per pt he has been paying out of pocket for most of his medications- has been out of town and using a [facility] - out of town- plans to stay in town here now- and will use [facility] on [address]- has a PCP- Selena Batten at [facility] on [address]- pt reports that he has some sort of drug coverage but does not have his insurance cards with him- will have his parents bring his insurance cards to the hospital to that CM can attempt to verify drug coverage on Monday AM- CM will f/u with pt over weekend once his parents bring in insurance cards to the bedside-   Expected Discharge Date:                  Expected Discharge Plan:  Home/Self Care  In-House Referral:     Discharge planning Services  CM Consult, Medication Assistance  Post Acute Care Choice:    Choice offered to:     DME Arranged:    DME Agency:     HH Arranged:    HH Agency:     Status of Service:  In process, will continue to follow  If discussed at Long Length of Stay Meetings, dates discussed:    Additional Comments:  Darrold Span, RN 02/05/2018, 3:18 PM

## 2018-02-05 NOTE — Progress Notes (Signed)
   Earlier having episodes of SVT with rates in the 150s. Asymptomatic.   He remains on milrinone 0.375 mcg. Will add amio 30 mg per hour while on milrinone. Repeat CO-OX now.l   Discussed with Dr Shirlee Latch.   Kady Toothaker NP-C  12:00 PM

## 2018-02-05 NOTE — Progress Notes (Addendum)
Advanced Heart Failure Rounding Note  PCP-Cardiologist: No primary care provider on file.   Subjective:    -1.2 liters with 60 mg IV lasix BID. Weight up 3 lbs (standing). Creatinine stable. Per nursing staff, seems to have sleep apnea. Required O2 overnight.  Started on milrinone 0.25 mcg/kg/min 02/05/18 for CI 1.2 on RHC. Co-ox 64% on milrinone yesterday evening but down to 50% this am. Recheck at 10 am. CVP 15-16 this am.  BP generally 130s/90s.   No CP. Orthopnea is better. No dizziness. Did not sleep well.   Echo: EF 15-20% with moderate LV dilation, mildly dilated RV with moderately decreased RV systolic function. The patient also has a bicuspid aortic valve with moderate AI.  The ascending aorta is dilated to 3.9 cm.   LHC/RHC (02/05/18): Extensive moderate but not critical CAD.  I would not expect this to have caused his EF to fall so markedly. On RHC, filling pressures were elevated with markedly low cardiac output and elevated SVR.  PAPi was low at 1.4 and CVP/PCWP high at 0.62 suggesting significant co-existing RV dysfunction.  RA 19 PA 60/40 (47) PAWP 35 PA sat 42% Ao sat 99% Fick CO 2.9 Fick CI 1.2  Objective:   Weight Range: 257 lb 8 oz (116.8 kg) Body mass index is 31.34 kg/m.   Vital Signs:   Temp:  [97.1 F (36.2 C)-98.7 F (37.1 C)] 97.8 F (36.6 C) (03/22 0312) Pulse Rate:  [66-113] 96 (03/22 0700) Resp:  [13-38] 20 (03/22 0700) BP: (104-142)/(64-103) 132/98 (03/22 0700) SpO2:  [90 %-100 %] 98 % (03/22 0700) Weight:  [257 lb 8 oz (116.8 kg)] 257 lb 8 oz (116.8 kg) (03/22 0500) Last BM Date: 02/02/18  Weight change: Filed Weights   02/02/18 2012 02/04/18 0648 02/05/18 0500  Weight: 259 lb 11.2 oz (117.8 kg) 254 lb (115.2 kg) 257 lb 8 oz (116.8 kg)    Intake/Output:   Intake/Output Summary (Last 24 hours) at 02/05/2018 0714 Last data filed at 02/05/2018 0700 Gross per 24 hour  Intake 385.98 ml  Output 1550 ml  Net -1164.02 ml      Physical  Exam   CVP 15-16 General:  No resp difficulty HEENT: Normal Neck: Supple. JVP elevated. Carotids 2+ bilat; no bruits. No lymphadenopathy or thyromegaly appreciated. Cor: PMI nondisplaced. Regular rate & rhythm. No rubs, gallops.  1/6 SEM RUSB. Lungs: diminished bases Abdomen: Soft, nontender, nondistended. No hepatosplenomegaly. No bruits or masses. Good bowel sounds. Extremities: No cyanosis, clubbing, rash, 1+ ankle edema.  Neuro: Alert & orientedx3, cranial nerves grossly intact. moves all 4 extremities w/o difficulty. Affect pleasant   Telemetry   SR 90s with PVCs (6-11/min). Personally reviewed.   EKG    No new tracings.  Labs    CBC Recent Labs    02/02/18 1247 02/02/18 2341  WBC 14.0* 13.4*  NEUTROABS  --  8.0*  HGB 15.4 14.4  HCT 47.1 44.1  MCV 89.0 88.2  PLT 259 277   Basic Metabolic Panel Recent Labs    16/10/96 0421 02/05/18 0324  NA 136 133*  K 3.1* 3.7  CL 101 99*  CO2 25 22  GLUCOSE 135* 175*  BUN 31* 29*  CREATININE 1.14 1.01  CALCIUM 8.6* 8.3*  MG 1.7 2.1   Liver Function Tests Recent Labs    02/02/18 1339  AST 33  ALT 16*  ALKPHOS 52  BILITOT 1.5*  PROT 6.7  ALBUMIN 3.6   No results for input(s): LIPASE, AMYLASE  in the last 72 hours. Cardiac Enzymes Recent Labs    02/02/18 1732 02/02/18 2341 02/03/18 0459  TROPONINI 0.20* 0.17* 0.24*    BNP: BNP (last 3 results) Recent Labs    02/02/18 1329  BNP 1,724.6*    ProBNP (last 3 results) No results for input(s): PROBNP in the last 8760 hours.   D-Dimer Recent Labs    02/02/18 1339  DDIMER 1.54*   Hemoglobin A1C Recent Labs    02/02/18 2341  HGBA1C 7.8*   Fasting Lipid Panel Recent Labs    02/03/18 0459  CHOL 95  HDL 33*  LDLCALC 44  TRIG 89  CHOLHDL 2.9   Thyroid Function Tests Recent Labs    02/02/18 1342  TSH 3.207    Other results:   Imaging    [place] Ekg Site Rite  Result Date: 02/04/2018 If Site Rite image not attached, placement could  not be confirmed due to current cardiac rhythm.     Medications:     Scheduled Medications: . aspirin  81 mg Oral Daily  . atorvastatin  40 mg Oral q1800  . digoxin  0.125 mg Oral Daily  . enoxaparin (LOVENOX) injection  40 mg Subcutaneous Q24H  . fluticasone  1 spray Each Nare Daily  . furosemide  60 mg Intravenous BID  . insulin aspart  0-15 Units Subcutaneous TID WC  . insulin aspart  0-5 Units Subcutaneous QHS  . mometasone-formoterol  2 puff Inhalation BID  . omega-3 acid ethyl esters  1 g Oral BID  . sacubitril-valsartan  1 tablet Oral BID  . sertraline  100 mg Oral Daily  . sodium chloride flush  10-40 mL Intracatheter Q12H  . sodium chloride flush  3 mL Intravenous Q12H  . sodium chloride flush  3 mL Intravenous Q12H     Infusions: . sodium chloride    . sodium chloride    . milrinone 0.25 mcg/kg/min (02/05/18 0700)     PRN Medications:  sodium chloride, sodium chloride, acetaminophen, albuterol, LORazepam, ondansetron (ZOFRAN) IV, sodium chloride flush, sodium chloride flush, sodium chloride flush, traZODone    Patient Profile   Curtis [name] is a 42 y.o. male with a history of HTN, asthma, HL, and DM.   Admitted for evaluation and treatment of fluid overload.   Assessment/Plan   1. Acute systolic HF Echo 1/61/0960 EF 45-40% with severe LV dilation and grade 1 DD, bicuspid aortic valve with mod AI, mod LA dilation, mod reduction in RV function, severe dilation RA, mild-mod TR, PA peak pressure 33 mmHg, and small pericardial effusion. HIV negative. No significant hx of ETOH or drug use. TSH normal. Family hx of CHF. ?viral etiology  - R/LHC 3/21 with multivessel CAD, high filling pressures, low CO/CI, and significant co-existing RV dysfunction - Volume status remains elevated.  - Increase lasix to 80 mg IV BID - Continue milrinone 0.25 mcg/kg/min  - Continue digoxin 0.125 mg daily - Hold BB with low output - Start Entresto 24/26 mg BID today.  -  Start spiro 12.5 mg daily  - PICC in place. CVP 15-16. Co-ox 63% > 50%. Will recheck now that he is awake.  - Will need cardiac MRI this admission - May end up needing to consider advanced therapies. RV function may be limiting. Blood type is A+.  - Will need to consider ICD if EF does not improve. May need LifeVest at discharge.   2. CAD LHC 02/04/18: Multivessel coronary artery disease, including 50% proximal LAD, 80% mid LCx  at takeoff of CTO'ed OM branch, and 60-70% distal RCA stenosis. - Troponin 0.20 > 0.17 > 0.24. Likely demand ischemia.  - Continue statin, ASA - No s/s ischemia  3. DM - Continue SSI per primary. A1C 7.8. No change.  - Consider jardiance in the future  4. HTN - Will manage with HF meds. SBP 100-140s.  5. HL - LDL 44 on 3/20 - Continue statin. No change.   6. Hypokalemia - K 3.7 this am. Will supp with continued diuresis.   7. OSA - Desats to 70s overnight requiring 3L [place] while sleeping per RN staff - Will need sleep study outpatient  Medication concerns reviewed with patient and pharmacy team. Barriers identified: None  Cardiac Rehab ordered. HF navigator made aware of patient.   Length of Stay: 3  Alford Highland, NP  02/05/2018, 7:14 AM  Advanced Heart Failure Team Pager [phone] (M-F; 7a - 4p)  Please contact [facility] for night-coverage after hours (4p -7a ) and weekends on [email or web address]  Patient see with NP, agree with the above note.    Had trouble sleeping last night but says orthopnea is better.  Not short of breath at rest.  Co-ox rose last night with milrinone but co-ox low this morning and again on repeat.  BP 130s/90s-100s.  CVP 15-16.   On exam, JVP 14+ cm, heart regular with no S3.  1+ ankle edema.    1. Acute on chronic systolic CHF: Echo 1/19 with EF 15-20%, moderate LV dilation, mildly dilated RV with moderately decreased RV systolic function.  He had coronary angiography this admission with moderate coronary disease,  but it does not explain low EF.  Possible viral myocarditis.  He had low output on RHC also with evidence for significant RV failure.  Co-ox is low again this morning and CVP remains high.  Creatinine stable.    - Increase milrinone to 0.375.  - Continue digoxin and start Entresto 24/26 bid.   - Spironolactone 12.5 daily.  - Lasix 80 mg IV bid today with metolazone 2.5 x 1.   - Repeat co-ox on higher milrinone and after getting digoxin/Entresto/spironolactone.   - Hold Coreg for now with low output HF.  - He will need cardiac MRI when more stable.  - I am worried that he may end up needing consideration of advanced therapies (LVAD, transplant).  RV function may be limiting.  Will work aggressively with oral medication regimen and milrinone over the next couple of days and hopefully with med titration and diuresis can eventually get him off milrinone.   2. CAD: Has strong family history of CAD.  On cath 3/21 he had extensive moderate CAD, but it does not explain his cardiomyopathy.  Most significant appears to be an 80% stenosis in the mid LCx.  Medical management for the time being.  - Continue ASA 81 and statin.  3. Bicuspid aortic valve disorder: He has moderate aortic insufficiency with a bicuspid aortic valve.  3.9 cm ascending aorta.   4. Type II diabetes: Insulin in hospital.   CRITICAL CARE Performed by: Marca Ancona  Total critical care time: 40 minutes  Critical care time was exclusive of separately billable procedures and treating other patients.  Critical care was necessary to treat or prevent imminent or life-threatening deterioration.  Critical care was time spent personally by me on the following activities: development of treatment plan with patient and/or surrogate as well as nursing, discussions with consultants, evaluation of patient's response to treatment, examination  of patient, obtaining history from patient or surrogate, ordering and performing treatments and  interventions, ordering and review of laboratory studies, ordering and review of radiographic studies, pulse oximetry and re-evaluation of patient's condition.  Marca Ancona 02/05/2018 9:03 AM

## 2018-02-05 NOTE — Progress Notes (Signed)
PROGRESS NOTE    Curtis [name]  ZOX:[id number] DOB: 12/20/1975 DOA: 02/02/2018 PCP: Gweneth Dimitri, MD   Brief Narrative: Curtis [name] is a 42 y.o. male with medical history significant of DM and HTN. Patient presented with edema and dyspnea and admitted for CHF exacerbation. He has responded well to diuresis.   Assessment & Plan:   Principal Problem:   CHF exacerbation (HCC) Active Problems:   Asthma in adult, unspecified asthma severity, uncomplicated   Essential hypertension   Diabetes mellitus type 2, controlled (HCC)   Hyperlipidemia   Elevated troponin   Acute on chronic combined systolic and diastolic CHF (congestive heart failure) (HCC)   Cardiogenic shock (HCC)   Acute combined systolic and diastolic heart failure Unknown etiology. Concern for ischemic cardiomyopathy. Function severely diminished at 15-20%. Bicuspid aortic valve with moderate regurgitation. CTA negative for PE. On room air. Weight is down 8 lbs from admission with a total UOP of 2.1L over the last 24 hours. Not at baseline. S/p heart cath on 3/21. Heart failure team consulted and have assumed care. Patient currently in CV ICU with milrinone drip.  Asthma Stable. -Continue Symbicort and albuterol  Essential hypertension -Continue losartan -Cardiology recommendations  Diabetes mellitus, type 2 Hemoglobin A1C of 7.8%. On metformin and glimepiride as an outpatient. Blood sugar elevated but less than 180. -Increase to SSI resistant and continue qHS dosing  Hyperlipidemia LDL of 44 -Continue Lipitor and fish oil  Medicine will sign off. If patient develops worsening blood sugar control, please don't hesitate to re-consult. Thank you!  DVT prophylaxis: Lovenox Code Status:   Code Status: Full Code Family Communication: None at bedside Disposition Plan: Discharge pending cardiac workup   Consultants:   Cardiology  Procedures:   None  Antimicrobials:  None    Subjective: Feels  better today. No chest pain or dyspnea.  Objective: Vitals:   02/05/18 0738 02/05/18 0800 02/05/18 0900 02/05/18 1000  BP:  (!) 130/93 (!) 129/93 122/76  Pulse:  99 (!) 101 72  Resp:  (!) 26 19 (!) 22  Temp: 98.1 F (36.7 C)     TempSrc: Oral     SpO2:  97% 97% 96%  Weight:      Height:        Intake/Output Summary (Last 24 hours) at 02/05/2018 1037 Last data filed at 02/05/2018 1000 Gross per 24 hour  Intake 776.33 ml  Output 2150 ml  Net -1373.67 ml   Filed Weights   02/02/18 2012 02/04/18 0648 02/05/18 0500  Weight: 117.8 kg (259 lb 11.2 oz) 115.2 kg (254 lb) 116.8 kg (257 lb 8 oz)    Examination:  General exam: Appears calm and comfortable Respiratory: Clear to auscultation bilaterally. Unlabored work of breathing. No wheezing or rales. Cardiovascular system: Regular rate and rhythm. Normal S1 and S2. No heart murmurs present. No extra heart sounds Gastrointestinal system: Abdomen is nondistended, soft and nontender. No organomegaly or masses felt. Normal bowel sounds heard. Central nervous system: Alert and oriented. No focal neurological deficits. Extremities: 1+ edema. No calf tenderness Skin: No cyanosis. No rashes Psychiatry: Judgement and insight appear normal. Mood & affect appropriate.     Data Reviewed: I have personally reviewed following labs and imaging studies  CBC: Recent Labs  Lab 02/02/18 1247 02/02/18 2341  WBC 14.0* 13.4*  NEUTROABS  --  8.0*  HGB 15.4 14.4  HCT 47.1 44.1  MCV 89.0 88.2  PLT 259 277   Basic Metabolic Panel: Recent Labs  Lab 02/02/18 1247  02/03/18 0459 02/04/18 0421 02/05/18 0324  NA 139 141 136 133*  K 3.5 3.7 3.1* 3.7  CL 107 103 101 99*  CO2 21* 24 25 22   GLUCOSE 110* 114* 135* 175*  BUN 31* 30* 31* 29*  CREATININE 1.12 1.20 1.14 1.01  CALCIUM 9.3 9.3 8.6* 8.3*  MG  --  1.7 1.7 2.1   GFR: Estimated Creatinine Clearance: 130.4 mL/min (by C-G formula based on SCr of 1.01 mg/dL). Liver Function  Tests: Recent Labs  Lab 02/02/18 1339  AST 33  ALT 16*  ALKPHOS 52  BILITOT 1.5*  PROT 6.7  ALBUMIN 3.6   No results for input(s): LIPASE, AMYLASE in the last 168 hours. No results for input(s): AMMONIA in the last 168 hours. Coagulation Profile: No results for input(s): INR, PROTIME in the last 168 hours. Cardiac Enzymes: Recent Labs  Lab 02/02/18 1732 02/02/18 2341 02/03/18 0459  TROPONINI 0.20* 0.17* 0.24*   BNP (last 3 results) No results for input(s): PROBNP in the last 8760 hours. HbA1C: Recent Labs    02/02/18 2341  HGBA1C 7.8*   CBG: Recent Labs  Lab 02/04/18 0746 02/04/18 1121 02/04/18 1739 02/04/18 2129 02/05/18 0736  GLUCAP 149* 183* 205* 146* 134*   Lipid Profile: Recent Labs    02/03/18 0459  CHOL 95  HDL 33*  LDLCALC 44  TRIG 89  CHOLHDL 2.9   Thyroid Function Tests: Recent Labs    02/02/18 1342  TSH 3.207   Anemia Panel: No results for input(s): VITAMINB12, FOLATE, FERRITIN, TIBC, IRON, RETICCTPCT in the last 72 hours. Sepsis Labs: No results for input(s): PROCALCITON, LATICACIDVEN in the last 168 hours.  Recent Results (from the past 240 hour(s))  MRSA PCR Screening     Status: None   Collection Time: 02/04/18  3:52 PM  Result Value Ref Range Status   MRSA by PCR NEGATIVE NEGATIVE Final    Comment:        The GeneXpert MRSA Assay (FDA approved for NASAL specimens only), is one component of a comprehensive MRSA colonization surveillance program. It is not intended to diagnose MRSA infection nor to guide or monitor treatment for MRSA infections. Performed at [facility], [address], [place], [place] [address]          Radiology Studies: [place] Ekg Site Rite  Result Date: 02/04/2018 If [facility] image not attached, placement could not be confirmed due to current cardiac rhythm.       Scheduled Meds: . aspirin  81 mg Oral Daily  . atorvastatin  40 mg Oral q1800  . digoxin  0.125 mg Oral Daily  .  enoxaparin (LOVENOX) injection  60 mg Subcutaneous Q24H  . fluticasone  1 spray Each Nare Daily  . furosemide  80 mg Intravenous BID  . insulin aspart  0-20 Units Subcutaneous TID WC  . insulin aspart  0-5 Units Subcutaneous QHS  . mometasone-formoterol  2 puff Inhalation BID  . omega-3 acid ethyl esters  1 g Oral BID  . potassium chloride  20 mEq Oral BID  . sacubitril-valsartan  1 tablet Oral BID  . sertraline  100 mg Oral Daily  . sodium chloride flush  10-40 mL Intracatheter Q12H  . sodium chloride flush  3 mL Intravenous Q12H  . sodium chloride flush  3 mL Intravenous Q12H  . spironolactone  12.5 mg Oral Daily   Continuous Infusions: . sodium chloride    . sodium chloride    . milrinone 0.375 mcg/kg/min (  02/05/18 0858)     LOS: 3 days     Jacquelin Hawking, MD [facility] 02/05/2018, 10:37 AM Pager: [phone]  If 7PM-7AM, please contact night-coverage [email or web address] Password [facility] 02/05/2018, 10:37 AM

## 2018-02-06 DIAGNOSIS — I472 Ventricular tachycardia: Secondary | ICD-10-CM

## 2018-02-06 LAB — GLUCOSE, CAPILLARY
GLUCOSE-CAPILLARY: 148 mg/dL — AB (ref 65–99)
GLUCOSE-CAPILLARY: 135 mg/dL — AB (ref 65–99)
GLUCOSE-CAPILLARY: 190 mg/dL — AB (ref 65–99)
Glucose-Capillary: 172 mg/dL — ABNORMAL HIGH (ref 65–99)

## 2018-02-06 LAB — BASIC METABOLIC PANEL
Anion gap: 12 (ref 5–15)
BUN: 21 mg/dL — AB (ref 6–20)
CO2: 25 mmol/L (ref 22–32)
CREATININE: 0.94 mg/dL (ref 0.61–1.24)
Calcium: 7.9 mg/dL — ABNORMAL LOW (ref 8.9–10.3)
Chloride: 93 mmol/L — ABNORMAL LOW (ref 101–111)
GFR calc Af Amer: >60 mL/min (ref >60)
GLUCOSE: 265 mg/dL — AB (ref 65–99)
POTASSIUM: 3 mmol/L — AB (ref 3.5–5.1)
Sodium: 130 mmol/L — ABNORMAL LOW (ref 135–145)

## 2018-02-06 LAB — CBC WITH DIFFERENTIAL/PLATELET
BASOS ABS: 0 10*3/uL (ref 0.0–0.1)
Basophils Relative: 0 %
Eosinophils Absolute: 0.2 10*3/uL (ref 0.0–0.7)
Eosinophils Relative: 2 %
HEMATOCRIT: 40.1 % (ref 39.0–52.0)
Hemoglobin: 13.6 g/dL (ref 13.0–17.0)
LYMPHS ABS: 2.1 10*3/uL (ref 0.7–4.0)
LYMPHS PCT: 26 %
MCH: 29.1 pg (ref 26.0–34.0)
MCHC: 33.9 g/dL (ref 30.0–36.0)
MCV: 85.9 fL (ref 78.0–100.0)
MONO ABS: 0.6 10*3/uL (ref 0.1–1.0)
Monocytes Relative: 7 %
NEUTROS ABS: 5.4 10*3/uL (ref 1.7–7.7)
Neutrophils Relative %: 65 %
Platelets: 180 10*3/uL (ref 150–400)
RBC: 4.67 MIL/uL (ref 4.22–5.81)
RDW: 13.9 % (ref 11.5–15.5)
WBC: 8.3 10*3/uL (ref 4.0–10.5)

## 2018-02-06 LAB — COOXEMETRY PANEL
Carboxyhemoglobin: 1.3 % (ref 0.5–1.5)
METHEMOGLOBIN: 0.8 % (ref 0.0–1.5)
O2 Saturation: 55 %
Total hemoglobin: 13.6 g/dL (ref 12.0–16.0)

## 2018-02-06 MED ORDER — POTASSIUM CHLORIDE CRYS ER 20 MEQ PO TBCR
40.0000 meq | EXTENDED_RELEASE_TABLET | Freq: Two times a day (BID) | ORAL | Status: DC
  Administered 2018-02-06 (×2): 40 meq via ORAL
  Filled 2018-02-06: qty 2

## 2018-02-06 MED ORDER — POTASSIUM CHLORIDE CRYS ER 20 MEQ PO TBCR
40.0000 meq | EXTENDED_RELEASE_TABLET | Freq: Two times a day (BID) | ORAL | Status: DC
  Administered 2018-02-06: 40 meq via ORAL
  Filled 2018-02-06: qty 2

## 2018-02-06 MED ORDER — SPIRONOLACTONE 25 MG PO TABS
25.0000 mg | ORAL_TABLET | Freq: Every day | ORAL | Status: DC
  Administered 2018-02-07 – 2018-02-12 (×6): 25 mg via ORAL
  Filled 2018-02-06 (×6): qty 1

## 2018-02-06 NOTE — Progress Notes (Addendum)
Advanced Heart Failure Rounding Note  PCP-Cardiologist: No primary care provider on file.   Subjective:    Started on milrinone 0.25 mcg/kg/min 02/05/18 for CI 1.2 on RHC.   Co-ox 49% on milrinone 0.375 yesterday. Increased to 0.5. Co-ox now 55%.  Massive diuresis yesterday with 9L out (negative -7.5L). Weight down 16 pounds.  CVP 3 (checked personally) Creatine stable. K 3.0  SBPs 120s  Feels more alert. Breathing much better. No orthopnea or PND. Still fatigued.   Tele with intermittent NSVT up to 11 beats. On IV amio.     Studies:  Echo: EF 15-20% with moderate LV dilation, mildly dilated RV with moderately decreased RV systolic function. The patient also has a bicuspid aortic valve with moderate AI.  The ascending aorta is dilated to 3.9 cm.   LHC/RHC (02/05/18): Extensive moderate but not critical CAD.  I would not expect this to have caused his EF to fall so markedly. On RHC, filling pressures were elevated with markedly low cardiac output and elevated SVR.  PAPi was low at 1.4 and CVP/PCWP high at 0.62 suggesting significant co-existing RV dysfunction.  RA 19 PA 60/40 (47) PAWP 35 PA sat 42% Ao sat 99% Fick CO 2.9 Fick CI 1.2  Objective:   Weight Range: 109.7 kg (241 lb 14.4 oz) Body mass index is 29.44 kg/m.   Vital Signs:   Temp:  [98.1 F (36.7 C)-98.8 F (37.1 C)] 98.8 F (37.1 C) (03/23 0909) Pulse Rate:  [51-153] 99 (03/23 0800) Resp:  [13-30] 20 (03/23 0800) BP: (114-133)/(74-97) 128/86 (03/23 0800) SpO2:  [91 %-100 %] 97 % (03/23 0800) Weight:  [109.7 kg (241 lb 14.4 oz)] 109.7 kg (241 lb 14.4 oz) (03/23 0500) Last BM Date: 02/05/18  Weight change: Filed Weights   02/04/18 0648 02/05/18 0500 02/06/18 0500  Weight: 115.2 kg (254 lb) 116.8 kg (257 lb 8 oz) 109.7 kg (241 lb 14.4 oz)    Intake/Output:   Intake/Output Summary (Last 24 hours) at 02/06/2018 0934 Last data filed at 02/06/2018 0800 Gross per 24 hour  Intake 1253.69 ml  Output 9100  ml  Net -7846.31 ml      Physical Exam   General:  Sitting in bed No resp difficulty HEENT: normal Neck: supple. no JVD. Carotids 2+ bilat; no bruits. No lymphadenopathy or thryomegaly appreciated. Cor: PMI laterally displaced. Regular rate & rhythm. +s3 Lungs: clear. Dull at bases Abdomen: soft, nontender, nondistended. No hepatosplenomegaly. No bruits or masses. Good bowel sounds. Extremities: no cyanosis, clubbing, rash, 2+ ankle edema. Multiple tattoos Neuro: alert & orientedx3, cranial nerves grossly intact. moves all 4 extremities w/o difficulty. Affect pleasant   Telemetry   SR 90s intermittent NSVT up to 11 beats . Occasiol PVCs was 8-11 per minute now 0-3 on amio Personally reviewed   EKG    No new tracings.  Labs    CBC Recent Labs    02/06/18 0357  WBC 8.3  NEUTROABS 5.4  HGB 13.6  HCT 40.1  MCV 85.9  PLT 180   Basic Metabolic Panel Recent Labs    16/10/96 0421 02/05/18 0324 02/06/18 0357  NA 136 133* 130*  K 3.1* 3.7 3.0*  CL 101 99* 93*  CO2 25 22 25   GLUCOSE 135* 175* 265*  BUN 31* 29* 21*  CREATININE 1.14 1.01 0.94  CALCIUM 8.6* 8.3* 7.9*  MG 1.7 2.1  --    Liver Function Tests No results for input(s): AST, ALT, ALKPHOS, BILITOT, PROT, ALBUMIN in the  last 72 hours. No results for input(s): LIPASE, AMYLASE in the last 72 hours. Cardiac Enzymes No results for input(s): CKTOTAL, CKMB, CKMBINDEX, TROPONINI in the last 72 hours.  BNP: BNP (last 3 results) Recent Labs    02/02/18 1329  BNP 1,724.6*    ProBNP (last 3 results) No results for input(s): PROBNP in the last 8760 hours.   D-Dimer No results for input(s): DDIMER in the last 72 hours. Hemoglobin A1C No results for input(s): HGBA1C in the last 72 hours. Fasting Lipid Panel No results for input(s): CHOL, HDL, LDLCALC, TRIG, CHOLHDL, LDLDIRECT in the last 72 hours. Thyroid Function Tests No results for input(s): TSH, T4TOTAL, T3FREE, THYROIDAB in the last 72  hours.  Invalid input(s): FREET3  Other results:   Imaging    No results found.   Medications:     Scheduled Medications: . aspirin  81 mg Oral Daily  . atorvastatin  40 mg Oral q1800  . digoxin  0.125 mg Oral Daily  . enoxaparin (LOVENOX) injection  60 mg Subcutaneous Q24H  . fluticasone  1 spray Each Nare Daily  . furosemide  80 mg Intravenous BID  . insulin aspart  0-20 Units Subcutaneous TID WC  . insulin aspart  0-5 Units Subcutaneous QHS  . mometasone-formoterol  2 puff Inhalation BID  . omega-3 acid ethyl esters  1 g Oral BID  . potassium chloride  20 mEq Oral BID  . potassium chloride  40 mEq Oral BID  . sacubitril-valsartan  1 tablet Oral BID  . sertraline  100 mg Oral Daily  . sodium chloride flush  10-40 mL Intracatheter Q12H  . sodium chloride flush  3 mL Intravenous Q12H  . sodium chloride flush  3 mL Intravenous Q12H  . spironolactone  12.5 mg Oral Daily    Infusions: . sodium chloride    . sodium chloride    . amiodarone 30 mg/hr (02/06/18 0036)  . milrinone 0.5 mcg/kg/min (02/06/18 0803)    PRN Medications: sodium chloride, sodium chloride, acetaminophen, albuterol, ALPRAZolam, LORazepam, ondansetron (ZOFRAN) IV, sodium chloride flush, sodium chloride flush, sodium chloride flush, traZODone    Patient Profile   Daymion Nazaire is a 42 y.o. male with a history of HTN, asthma, HL, and DM.   Admitted for evaluation and treatment of fluid overload.   Assessment/Plan   1. Acute on chronic systolic CHF -> cardiogenic shock: Echo 02/03/18 with EF 15-20%, moderate LV dilation, mildly dilated RV with moderately decreased RV systolic function.  He had coronary angiography this admission with moderate coronary disease, but it does not explain low EF.  Possible viral myocarditis.  He had low output on RHC also with evidence for significant RV failure.  Co-ox up to 55% on milrinone 0.5. CVP down to 3 - Co-ox remains marginal on high-dose milrinone at 0.5.  Will continue - Continue digoxin and Entresto 24/26 bid.   - Increase Spironolactone to 25 daily.  - Stop IV lasix today - Hold Coreg for now with low output HF.  - Plan cardiac MRI - Blood type is A+.  - Etiology unclear. 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 Also had fairly frequent PVCs but not sure if the burden was high enough to cause CM. Await results of cMRI. Continue amio.  - Long talk with him and him mother about situation and possible need for advanced therapies including transfer to [facility] for transplant eval.  Will work aggressively with  oral medication regimen and milrinone over the next couple of days and hopefully will continue to improve with time and med titration. If co-ox falling would have low threshold to transfer   2. CAD: Has strong family history of CAD.  On cath 3/21 he had extensive moderate CAD, but it does not explain his cardiomyopathy.  Most significant appears to be an 80% stenosis in the mid LCx.  Medical management for the time being.  - Continue ASA 81 and statin.   3. Bicuspid aortic valve disorder: He has moderate aortic insufficiency with a bicuspid aortic valve.  3.9 cm ascending aorta.   . 4. DM - Continue SSI per primary. A1C 7.8. No change.  - Consider jardiance in the future   5. HL - LDL 44 on 3/20 - Continue statin. No change.   6. Hypokalemia - K 3.0 this am. Will supp with continued diuresis.   7. OSA - Desats to 70s overnight requiring 3L [place] while sleeping per RN staff - Will need sleep study outpatient  8. Frequent PVCs and NSVT - continue IV amio - Keep K > 4.0 MG> 2.0  CRITICAL CARE Performed by: Arvilla Meres  Total critical care time: 35 minutes  Critical care time was exclusive of separately billable procedures and treating other patients.  Critical care was necessary to treat or prevent imminent or life-threatening deterioration.  Critical care was time spent  personally by me (independent of midlevel providers or residents) on the following activities: development of treatment plan with patient and/or surrogate as well as nursing, discussions with consultants, evaluation of patient's response to treatment, examination of patient, obtaining history from patient or surrogate, ordering and performing treatments and interventions, ordering and review of laboratory studies, ordering and review of radiographic studies, pulse oximetry and re-evaluation of patient's condition.    Length of Stay: 4  Arvilla Meres, MD  02/06/2018, 9:34 AM  Advanced Heart Failure Team Pager [phone] (M-F; 7a - 4p)  Please contact [facility] for night-coverage after hours (4p -7a ) and weekends on [email or web address]

## 2018-02-06 NOTE — Progress Notes (Signed)
CARDIAC REHAB PHASE I   PRE:  Rate/Rhythm: 97 SR  BP:  Supine: 125/87 Sitting:   Standing:    SaO2: 99% 2 L O2  MODE:  Ambulation: 740 ft   POST:  Rate/Rhythm: 115 ST  BP:  Supine:   Sitting: 126/76  Standing:    SaO2: 99% RA  8916-9450 Patient tolerated ambulation well with assist x1 and without c/o, VSS. Oxygen saturation 99% on room air. Call bell within reach, IV intact.   Artist Pais, MS, ACSM CEP

## 2018-02-06 NOTE — Discharge Instructions (Signed)
Heart Failure Heart failure means your heart has trouble pumping blood. This makes it hard for your body to work well. Heart failure is usually a long-term (chronic) condition. You must take good care of yourself and follow your doctor's treatment plan. Follow these instructions at home:  Take your heart medicine as told by your doctor. ? Do not stop taking medicine unless your doctor tells you to. ? Do not skip any dose of medicine. ? Refill your medicines before they run out. ? Take other medicines only as told by your doctor or pharmacist.  Stay active if told by your doctor. The elderly and people with severe heart failure should talk with a doctor about physical activity.  Eat heart-healthy foods. Choose foods that are without trans fat and are low in saturated fat, cholesterol, and salt (sodium). This includes fresh or frozen fruits and vegetables, fish, lean meats, fat-free or low-fat dairy foods, whole grains, and high-fiber foods. Lentils and dried peas and beans (legumes) are also good choices.  Limit salt if told by your doctor.  Cook in a healthy way. Roast, grill, broil, bake, poach, steam, or stir-fry foods.  Limit fluids as told by your doctor.  Weigh yourself every morning. Do this after you pee (urinate) and before you eat breakfast. Write down your weight to give to your doctor.  Take your blood pressure and write it down if your doctor tells you to.  Ask your doctor how to check your pulse. Check your pulse as told.  Lose weight if told by your doctor.  Stop smoking or chewing tobacco. Do not use gum or patches that help you quit without your doctor's approval.  Schedule and go to doctor visits as told.  Nonpregnant women should have no more than 1 drink a day. Men should have no more than 2 drinks a day. Talk to your doctor about drinking alcohol.  Stop illegal drug use.  Stay current with shots (immunizations).  Manage your health conditions as told by your  doctor.  Learn to manage your stress.  Rest when you are tired.  If it is really hot outside: ? Avoid intense activities. ? Use air conditioning or fans, or get in a cooler place. ? Avoid caffeine and alcohol. ? Wear loose-fitting, lightweight, and light-colored clothing.  If it is really cold outside: ? Avoid intense activities. ? Layer your clothing. ? Wear mittens or gloves, a hat, and a scarf when going outside. ? Avoid alcohol.  Learn about heart failure and get support as needed.  Get help to maintain or improve your quality of life and your ability to care for yourself as needed. Contact a doctor if:  You gain weight quickly.  You are more short of breath than usual.  You cannot do your normal activities.  You tire easily.  You cough more than normal, especially with activity.  You have any or more puffiness (swelling) in areas such as your hands, feet, ankles, or belly (abdomen).  You cannot sleep because it is hard to breathe.  You feel like your heart is beating fast (palpitations).  You get dizzy or light-headed when you stand up. Get help right away if:  You have trouble breathing.  There is a change in mental status, such as becoming less alert or not being able to focus.  You have chest pain or discomfort.  You faint. This information is not intended to replace advice given to you

## 2018-02-06 NOTE — Plan of Care (Signed)
Admitted with CHF low EfF Workup including cMRI to eval. , will have Monday. On  amiodarone at 30mg  and milnerone at .5. Coox 55%  ambulating well without marked Dyspnea. Using o2 for comfort, diuresed well ( over 7 liters) CVP 1-3 . Lungs clear, lower extremities still with edema, left over right ED hose on, continue to monitor daily co-ox. Milnerone at high dose, Dr Adriana Reams discussed possibility of transfer for transplant work up if coox decreases on current therapies. Patient understands processes but is somewhat overwhelmed. Forgetful at times about chain of events. Educational materials given to patient. Discussion and questions answered with patient and parents.

## 2018-02-07 ENCOUNTER — Other Ambulatory Visit: Payer: Self-pay

## 2018-02-07 LAB — GLUCOSE, CAPILLARY
GLUCOSE-CAPILLARY: 136 mg/dL — AB (ref 65–99)
GLUCOSE-CAPILLARY: 164 mg/dL — AB (ref 65–99)
Glucose-Capillary: 123 mg/dL — ABNORMAL HIGH (ref 65–99)
Glucose-Capillary: 190 mg/dL — ABNORMAL HIGH (ref 65–99)

## 2018-02-07 LAB — COOXEMETRY PANEL
CARBOXYHEMOGLOBIN: 1.2 % (ref 0.5–1.5)
CARBOXYHEMOGLOBIN: 1 % (ref 0.5–1.5)
METHEMOGLOBIN: 1.4 % (ref 0.0–1.5)
METHEMOGLOBIN: 1.2 % (ref 0.0–1.5)
O2 SAT: 59.6 %
O2 Saturation: 72.4 %
TOTAL HEMOGLOBIN: 13.9 g/dL (ref 12.0–16.0)
TOTAL HEMOGLOBIN: 14.6 g/dL (ref 12.0–16.0)

## 2018-02-07 LAB — BASIC METABOLIC PANEL
ANION GAP: 10 (ref 5–15)
BUN: 13 mg/dL (ref 6–20)
CHLORIDE: 99 mmol/L — AB (ref 101–111)
CO2: 26 mmol/L (ref 22–32)
Calcium: 8.4 mg/dL — ABNORMAL LOW (ref 8.9–10.3)
Creatinine, Ser: 0.92 mg/dL (ref 0.61–1.24)
GFR calc Af Amer: >60 mL/min (ref >60)
GFR calc non Af Amer: >60 mL/min (ref >60)
Glucose, Bld: 229 mg/dL — ABNORMAL HIGH (ref 65–99)
POTASSIUM: 4.1 mmol/L (ref 3.5–5.1)
Sodium: 135 mmol/L (ref 135–145)

## 2018-02-07 LAB — CBC WITH DIFFERENTIAL/PLATELET
BASOS ABS: 0 10*3/uL (ref 0.0–0.1)
BASOS PCT: 0 %
EOS ABS: 0.1 10*3/uL (ref 0.0–0.7)
EOS PCT: 2 %
HCT: 40.8 % (ref 39.0–52.0)
Hemoglobin: 13.5 g/dL (ref 13.0–17.0)
LYMPHS PCT: 26 %
Lymphs Abs: 1.9 10*3/uL (ref 0.7–4.0)
MCH: 28.2 pg (ref 26.0–34.0)
MCHC: 33.1 g/dL (ref 30.0–36.0)
MCV: 85.4 fL (ref 78.0–100.0)
Monocytes Absolute: 0.7 10*3/uL (ref 0.1–1.0)
Monocytes Relative: 10 %
Neutro Abs: 4.7 10*3/uL (ref 1.7–7.7)
Neutrophils Relative %: 62 %
PLATELETS: 177 10*3/uL (ref 150–400)
RBC: 4.78 MIL/uL (ref 4.22–5.81)
RDW: 13.4 % (ref 11.5–15.5)
WBC: 7.4 10*3/uL (ref 4.0–10.5)

## 2018-02-07 LAB — MAGNESIUM: MAGNESIUM: 1.8 mg/dL (ref 1.7–2.4)

## 2018-02-07 MED ORDER — MAGNESIUM SULFATE 2 GM/50ML IV SOLN
2.0000 g | Freq: Once | INTRAVENOUS | Status: AC
  Administered 2018-02-07: 2 g via INTRAVENOUS
  Filled 2018-02-07: qty 50

## 2018-02-07 MED ORDER — SACUBITRIL-VALSARTAN 49-51 MG PO TABS
1.0000 | ORAL_TABLET | Freq: Two times a day (BID) | ORAL | Status: AC
  Administered 2018-02-07 – 2018-02-08 (×2): 1 via ORAL
  Filled 2018-02-07 (×3): qty 1

## 2018-02-07 MED ORDER — ENOXAPARIN SODIUM 60 MG/0.6ML ~~LOC~~ SOLN
50.0000 mg | SUBCUTANEOUS | Status: DC
  Administered 2018-02-07 – 2018-02-11 (×5): 50 mg via SUBCUTANEOUS
  Filled 2018-02-07 (×5): qty 0.5

## 2018-02-07 NOTE — Progress Notes (Signed)
Advanced Heart Failure Rounding Note  PCP-Cardiologist: No primary care provider on file.   Subjective:    Started on milrinone 0.25 mcg/kg/min 02/05/18 for CI 1.2 on RHC.   Co-ox 49% on milrinone 0.375 yesterday. Increased to 0.5. Co-ox 55% yesterday and now 72%  Feels much better. Breathing well. No longer foggy-headed. Diuretics stopped yesterday. Weight down another 7 pounds (28 pounds total) CVP 3-4.   K 4.1  SBPs 120s  PVCs and NSVT quieting down on tele.    Studies:  Echo: EF 15-20% with moderate LV dilation, mildly dilated RV with moderately decreased RV systolic function. The patient also has a bicuspid aortic valve with moderate AI.  The ascending aorta is dilated to 3.9 cm.   LHC/RHC (02/05/18): Extensive moderate but not critical CAD.  I would not expect this to have caused his EF to fall so markedly. On RHC, filling pressures were elevated with markedly low cardiac output and elevated SVR.  PAPi was low at 1.4 and CVP/PCWP high at 0.62 suggesting significant co-existing RV dysfunction.  RA 19 PA 60/40 (47) PAWP 35 PA sat 42% Ao sat 99% Fick CO 2.9 Fick CI 1.2  Objective:   Weight Range: 106.3 kg (234 lb 5.6 oz) Body mass index is 28.53 kg/m.   Vital Signs:   Temp:  [98.3 F (36.8 C)-98.8 F (37.1 C)] 98.3 F (36.8 C) (03/24 1203) Pulse Rate:  [88-101] 100 (03/24 1400) Resp:  [15-34] 24 (03/24 1400) BP: (113-145)/(71-93) 124/86 (03/24 1400) SpO2:  [89 %-98 %] 98 % (03/24 1400) Weight:  [106.3 kg (234 lb 5.6 oz)] 106.3 kg (234 lb 5.6 oz) (03/24 0500) Last BM Date: 02/05/18  Weight change: Filed Weights   02/05/18 0500 02/06/18 0500 02/07/18 0500  Weight: 116.8 kg (257 lb 8 oz) 109.7 kg (241 lb 14.4 oz) 106.3 kg (234 lb 5.6 oz)    Intake/Output:   Intake/Output Summary (Last 24 hours) at 02/07/2018 1441 Last data filed at 02/07/2018 1400 Gross per 24 hour  Intake 1706 ml  Output 4350 ml  Net -2644 ml      Physical Exam   General:  Sitting  up in bed No resp difficulty HEENT: normal Neck: supple. no JVD. Carotids 2+ bilat; no bruits. No lymphadenopathy or thryomegaly appreciated. Cor: PMI laterally displaced. Regular rate & rhythm. +s3 Lungs: clear Abdomen: soft, nontender, nondistended. No hepatosplenomegaly. No bruits or masses. Good bowel sounds. Extremities: no cyanosis, clubbing, rash, edema RUE picc  Neuro: alert & orientedx3, cranial nerves grossly intact. moves all 4 extremities w/o difficulty. Affect pleasant   Telemetry   SR 90-100 1 4 beat run NSVT and occasioanl PVCs (now 0-3 per minute). Personally reviewed  EKG    No new tracings.  Labs    CBC Recent Labs    02/06/18 0357 02/07/18 0314  WBC 8.3 7.4  NEUTROABS 5.4 4.7  HGB 13.6 13.5  HCT 40.1 40.8  MCV 85.9 85.4  PLT 180 177   Basic Metabolic Panel Recent Labs    16/10/96 0324 02/06/18 0357 02/07/18 0314  NA 133* 130* 135  K 3.7 3.0* 4.1  CL 99* 93* 99*  CO2 22 25 26   GLUCOSE 175* 265* 229*  BUN 29* 21* 13  CREATININE 1.01 0.94 0.92  CALCIUM 8.3* 7.9* 8.4*  MG 2.1  --  1.8   Liver Function Tests No results for input(s): AST, ALT, ALKPHOS, BILITOT, PROT, ALBUMIN in the last 72 hours. No results for input(s): LIPASE, AMYLASE in the  last 72 hours. Cardiac Enzymes No results for input(s): CKTOTAL, CKMB, CKMBINDEX, TROPONINI in the last 72 hours.  BNP: BNP (last 3 results) Recent Labs    02/02/18 1329  BNP 1,724.6*    ProBNP (last 3 results) No results for input(s): PROBNP in the last 8760 hours.   D-Dimer No results for input(s): DDIMER in the last 72 hours. Hemoglobin A1C No results for input(s): HGBA1C in the last 72 hours. Fasting Lipid Panel No results for input(s): CHOL, HDL, LDLCALC, TRIG, CHOLHDL, LDLDIRECT in the last 72 hours. Thyroid Function Tests No results for input(s): TSH, T4TOTAL, T3FREE, THYROIDAB in the last 72 hours.  Invalid input(s): FREET3  Other results:   Imaging    No results  found.   Medications:     Scheduled Medications: . aspirin  81 mg Oral Daily  . atorvastatin  40 mg Oral q1800  . digoxin  0.125 mg Oral Daily  . enoxaparin (LOVENOX) injection  50 mg Subcutaneous Q24H  . fluticasone  1 spray Each Nare Daily  . insulin aspart  0-20 Units Subcutaneous TID WC  . insulin aspart  0-5 Units Subcutaneous QHS  . mometasone-formoterol  2 puff Inhalation BID  . omega-3 acid ethyl esters  1 g Oral BID  . sacubitril-valsartan  1 tablet Oral BID  . sertraline  100 mg Oral Daily  . sodium chloride flush  10-40 mL Intracatheter Q12H  . sodium chloride flush  3 mL Intravenous Q12H  . sodium chloride flush  3 mL Intravenous Q12H  . spironolactone  25 mg Oral Daily    Infusions: . sodium chloride    . sodium chloride    . amiodarone 30 mg/hr (02/07/18 1239)  . milrinone 0.5 mcg/kg/min (02/07/18 1239)    PRN Medications: sodium chloride, sodium chloride, acetaminophen, albuterol, ALPRAZolam, LORazepam, ondansetron (ZOFRAN) IV, sodium chloride flush, sodium chloride flush, sodium chloride flush, traZODone    Patient Profile   Curtis [name] is a 42 y.o. male with a history of HTN, asthma, HL, and DM.   Admitted for evaluation and treatment of fluid overload.   Assessment/Plan   1. Acute on chronic systolic CHF -> cardiogenic shock: Echo 02/03/18 with EF 15-20%, moderate LV dilation, mildly dilated RV with moderately decreased RV systolic function.  He had coronary angiography this admission with moderate coronary disease, but it does not explain low EF.  Possible viral myocarditis.  He had low output on RHC also with evidence for significant RV failure.  Co-ox up to 72% on milrinone 0.5. CVP stable 3-4. Has diuresed 28 pounds - Co-ox much improved on high-dose milrinone at 0.5. Will repeat co-ox now if >= 65% will drop milrinone to 0.25 - Continue digoxin - Increase Entresto 49/51 bid.   - Continue spironolactone to 25 daily.  - Continue to hold  diuretics - Hold Coreg for now with low output HF.  - Plan cardiac MRI tomorrow - Blood type is A+.  - Etiology unclear. 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 Also had fairly frequent PVCs but not sure if the burden was high enough to cause CM. Await results of cMRI. Continue amio.  - Long talk with him and him mother yesterdayu about situation and possible need for advanced therapies including transfer to [facility] for transplant eval. He is fortunately responding well to medical therapy.  Will continue to titrate meds and wean inotropes as tolerated.   2. CAD: Has strong family  history of CAD.  On cath 3/21 he had extensive moderate CAD, but it does not explain his cardiomyopathy.  Most significant appears to be an 80% stenosis in the mid LCx.  Medical management for the time being.  - No s/s angina. Continue ASA 81 and statin.   3. Bicuspid aortic valve disorder: He has moderate aortic insufficiency with a bicuspid aortic valve.  3.9 cm ascending aorta.   . 4. DM - Continue SSI per primary. A1C 7.8. No change.  - Consider jardiance in the future   5. HL - LDL 44 on 3/20 - Continue statin. No change.   6. Hypokalemia - K 4.1 this am.  7. OSA - Desats to 70s overnight requiring 3L [address] while sleeping per RN staff - Will need sleep study outpatient  8. Frequent PVCs and NSVT - much improved on tele today (Personally reviewed) will continue IV amio while on high-does milrinone. Switch to po soon.  - Keep K > 4.0 MG> 2.0  Can go to SDU   Length of Stay: 5  Arvilla Meres, MD  02/07/2018, 2:41 PM  Advanced Heart Failure Team Pager [phone] (M-F; 7a - 4p)  Please contact [facility] for night-coverage after hours (4p -7a ) and weekends on [email or web address]

## 2018-02-07 NOTE — Plan of Care (Signed)
  Problem: Education: Goal: Knowledge of General Education information will improve Outcome: Progressing   Problem: Health Behavior/Discharge Planning: Goal: Ability to manage health-related needs will improve Outcome: Progressing   Problem: Clinical Measurements: Goal: Ability to maintain clinical measurements within normal limits will improve Outcome: Progressing Goal: Will remain free from infection Outcome: Progressing Goal: Diagnostic test results will improve Outcome: Progressing Goal: Respiratory complications will improve Outcome: Progressing Goal: Cardiovascular complication will be avoided Outcome: Progressing   Problem: Elimination: Goal: Will not experience complications related to bowel motility Outcome: Progressing   Problem: Safety: Goal: Ability to remain free from injury will improve Outcome: Progressing   Problem: Skin Integrity: Goal: Risk for impaired skin integrity will decrease Outcome: Progressing   Problem: Education: Goal: Ability to demonstrate management of disease process will improve Outcome: Progressing Goal: Ability to verbalize understanding of medication therapies will improve Outcome: Progressing   Problem: Activity: Goal: Capacity to carry out activities will improve Outcome: Progressing Note:  Ambulated 717f this AM without shortness of breath.    Problem: Cardiac: Goal: Ability to achieve and maintain adequate cardiopulmonary perfusion will improve Outcome: Progressing Note:  BLE edema improving, coox this morning was 77%, weight continues to decrease as patient diureses.    Problem: Coping: Goal: Level of anxiety will decrease Outcome: Completed/Met

## 2018-02-08 ENCOUNTER — Inpatient Hospital Stay (HOSPITAL_COMMUNITY): Payer: No Typology Code available for payment source

## 2018-02-08 DIAGNOSIS — I429 Cardiomyopathy, unspecified: Secondary | ICD-10-CM

## 2018-02-08 LAB — CBC WITH DIFFERENTIAL/PLATELET
Basophils Absolute: 0 10*3/uL (ref 0.0–0.1)
Basophils Relative: 0 %
EOS ABS: 0.2 10*3/uL (ref 0.0–0.7)
Eosinophils Relative: 3 %
HCT: 40.8 % (ref 39.0–52.0)
Hemoglobin: 13.5 g/dL (ref 13.0–17.0)
Lymphocytes Relative: 28 %
Lymphs Abs: 2.1 10*3/uL (ref 0.7–4.0)
MCH: 28.2 pg (ref 26.0–34.0)
MCHC: 33.1 g/dL (ref 30.0–36.0)
MCV: 85.4 fL (ref 78.0–100.0)
MONO ABS: 0.8 10*3/uL (ref 0.1–1.0)
MONOS PCT: 10 %
NEUTROS PCT: 59 %
Neutro Abs: 4.3 10*3/uL (ref 1.7–7.7)
Platelets: 182 10*3/uL (ref 150–400)
RBC: 4.78 MIL/uL (ref 4.22–5.81)
RDW: 13.4 % (ref 11.5–15.5)
WBC: 7.4 10*3/uL (ref 4.0–10.5)

## 2018-02-08 LAB — HEPATITIS PANEL, ACUTE
HCV Ab: <0.1 s/co ratio (ref 0.0–0.9)
HEP A IGM: NEGATIVE
Hep B C IgM: NEGATIVE
Hepatitis B Surface Ag: NEGATIVE

## 2018-02-08 LAB — GLUCOSE, CAPILLARY
GLUCOSE-CAPILLARY: 270 mg/dL — AB (ref 65–99)
Glucose-Capillary: 123 mg/dL — ABNORMAL HIGH (ref 65–99)
Glucose-Capillary: 117 mg/dL — ABNORMAL HIGH (ref 65–99)

## 2018-02-08 LAB — COOXEMETRY PANEL
CARBOXYHEMOGLOBIN: 1.5 % (ref 0.5–1.5)
METHEMOGLOBIN: 0.9 % (ref 0.0–1.5)
O2 SAT: 60.8 %
Total hemoglobin: 13.7 g/dL (ref 12.0–16.0)

## 2018-02-08 LAB — BASIC METABOLIC PANEL
ANION GAP: 9 (ref 5–15)
BUN: 10 mg/dL (ref 6–20)
CALCIUM: 8.2 mg/dL — AB (ref 8.9–10.3)
CO2: 24 mmol/L (ref 22–32)
Chloride: 101 mmol/L (ref 101–111)
Creatinine, Ser: 0.84 mg/dL (ref 0.61–1.24)
Glucose, Bld: 247 mg/dL — ABNORMAL HIGH (ref 65–99)
POTASSIUM: 3.9 mmol/L (ref 3.5–5.1)
Sodium: 134 mmol/L — ABNORMAL LOW (ref 135–145)

## 2018-02-08 LAB — MAGNESIUM: MAGNESIUM: 2 mg/dL (ref 1.7–2.4)

## 2018-02-08 MED ORDER — SACUBITRIL-VALSARTAN 97-103 MG PO TABS
1.0000 | ORAL_TABLET | Freq: Two times a day (BID) | ORAL | Status: DC
  Administered 2018-02-08 – 2018-02-12 (×8): 1 via ORAL
  Filled 2018-02-08 (×8): qty 1

## 2018-02-08 MED ORDER — GADOBENATE DIMEGLUMINE 529 MG/ML IV SOLN
38.0000 mL | Freq: Once | INTRAVENOUS | Status: AC | PRN
  Administered 2018-02-08: 38 mL via INTRAVENOUS

## 2018-02-08 NOTE — Care Management Note (Signed)
Case Management Note Donn Pierini RN, BSN Unit 4E-Case Manager-- 2H coverage [phone]  Patient Details  Name: Curtis [name] MRN: [id number] Date of Birth: 1976-07-12  Subjective/Objective:  Pt admitted with CHF                  Action/Plan: PTA pt lived at home, referral for [facility] received- attempted to complete insurance check- however unable to verify pt's drug coverage- spoke with pt at bedside- per pt he has been paying out of pocket for most of his medications- has been out of town and using a [facility] - out of town- plans to stay in town here now- and will use [facility] on [address]- has a PCP- Selena Batten at [facility] on [address]- pt reports that he has some sort of drug coverage but does not have his insurance cards with him- will have his parents bring his insurance cards to the hospital to that CM can attempt to verify drug coverage on Monday AM- CM will f/u with pt over weekend once his parents bring in insurance cards to the bedside-   Expected Discharge Date:                  Expected Discharge Plan:  Home/Self Care  In-House Referral:     Discharge planning Services  CM Consult, Medication Assistance  Post Acute Care Choice:    Choice offered to:     DME Arranged:    DME Agency:     HH Arranged:    HH Agency:     Status of Service:  In process, will continue to follow  If discussed at Long Length of Stay Meetings, dates discussed:    Additional Comments:  02/08/18- 1340- Benetta Maclaren RN, CM- pt's parent brought in prescription coverage info- pt only has prescription discount card- NO drug coverage insurance- Pt will try to use the discount card for all mediations but will not know what discount he has until he goes to pharmacy- CM will provide a 30 day free card for [facility]- pt will need to call toll free # for entrestro to see about further assistance available to him- have also spoken with Daphne with HF clinic who will f/u with patient regarding  medications and f/u with clinic. CM will continue to follow for transition of care needs.   Darrold Span, RN 02/08/2018, 1:39 PM

## 2018-02-08 NOTE — Progress Notes (Signed)
Pt just back from walking three laps. Felt well. HR minimally elevated.  Sts he enjoys walking but knows to not overdo. I encouraged more walking later. We discussed CRPII (pt and mother asking questions). Will refer to [facility] CRPII but we will see how his progression goes (unfortunately do not take milrinone in CRPII). Will f/u as time allows. [id number] Ethelda Chick CES, ACSM 11:03 AM 02/08/2018

## 2018-02-08 NOTE — Progress Notes (Addendum)
Advanced Heart Failure Rounding Note  PCP-Cardiologist: No primary care provider on file.   Subjective:    Started on milrinone 0.25 mcg/kg/min 02/05/18 for CI 1.2 on RHC, milrinone increased due to persistently low output.   Co-ox 60.8% on milrinone 0.5 mcg/kg/min. K 3.9. Cr 0.84.  Weight down another 3 lbs, 31 lbs total.   Continues to improve. Denies SOB, lightheadedness or dizziness. No further orthopnea. Denies palpitations or CP.   Studies:  Echo: EF 15-20% with moderate LV dilation, mildly dilated RV with moderately decreased RV systolic function. The patient also has a bicuspid aortic valve with moderate AI.  The ascending aorta is dilated to 3.9 cm.   LHC/RHC (02/05/18): Extensive moderate but not critical CAD.  I would not expect this to have caused his EF to fall so markedly. On RHC, filling pressures were elevated with markedly low cardiac output and elevated SVR.  PAPi was low at 1.4 and CVP/PCWP high at 0.62 suggesting significant co-existing RV dysfunction.  RA 19 PA 60/40 (47) PAWP 35 PA sat 42% Ao sat 99% Fick CO 2.9 Fick CI 1.2  Objective:   Weight Range: 231 lb 8 oz (105 kg) Body mass index is 28.18 kg/m.   Vital Signs:   Temp:  [98.2 F (36.8 C)-98.7 F (37.1 C)] 98.2 F (36.8 C) (03/25 0400) Pulse Rate:  [85-103] 85 (03/25 0600) Resp:  [15-35] 16 (03/25 0600) BP: (114-138)/(71-92) 119/73 (03/25 0600) SpO2:  [95 %-100 %] 98 % (03/25 0600) Weight:  [231 lb 8 oz (105 kg)] 231 lb 8 oz (105 kg) (03/25 0408) Last BM Date: 02/07/18  Weight change: Filed Weights   02/06/18 0500 02/07/18 0500 02/08/18 0408  Weight: 241 lb 14.4 oz (109.7 kg) 234 lb 5.6 oz (106.3 kg) 231 lb 8 oz (105 kg)    Intake/Output:   Intake/Output Summary (Last 24 hours) at 02/08/2018 0716 Last data filed at 02/08/2018 0600 Gross per 24 hour  Intake 1552 ml  Output 4900 ml  Net -3348 ml     Physical Exam   General: Well appearing. No resp difficulty. HEENT:  Normal Neck: Supple. JVP ~7 cm. Carotids 2+ bilat; no bruits. No thyromegaly or nodule noted. Cor: PMI nondisplaced. RRR, +S3 Lungs: CTAB, normal effort. Abdomen: Soft, non-tender, non-distended, no HSM. No bruits or masses. +BS  Extremities: No cyanosis, clubbing, or rash. R and LLE no edema.  RUE PICC Neuro: Alert & orientedx3, cranial nerves grossly intact. moves all 4 extremities w/o difficulty. Affect pleasant    Telemetry   NSR 80-90s, occasional PVCs, personally reviewed.   EKG    No new tracings.    Labs    CBC Recent Labs    02/07/18 0314 02/08/18 0400  WBC 7.4 7.4  NEUTROABS 4.7 4.3  HGB 13.5 13.5  HCT 40.8 40.8  MCV 85.4 85.4  PLT 177 182   Basic Metabolic Panel Recent Labs    70/17/79 0314 02/08/18 0400  NA 135 134*  K 4.1 3.9  CL 99* 101  CO2 26 24  GLUCOSE 229* 247*  BUN 13 10  CREATININE 0.92 0.84  CALCIUM 8.4* 8.2*  MG 1.8 2.0   Liver Function Tests No results for input(s): AST, ALT, ALKPHOS, BILITOT, PROT, ALBUMIN in the last 72 hours. No results for input(s): LIPASE, AMYLASE in the last 72 hours. Cardiac Enzymes No results for input(s): CKTOTAL, CKMB, CKMBINDEX, TROPONINI in the last 72 hours.  BNP: BNP (last 3 results) Recent Labs    02/02/18  1329  BNP 1,724.6*    ProBNP (last 3 results) No results for input(s): PROBNP in the last 8760 hours.   D-Dimer No results for input(s): DDIMER in the last 72 hours. Hemoglobin A1C No results for input(s): HGBA1C in the last 72 hours. Fasting Lipid Panel No results for input(s): CHOL, HDL, LDLCALC, TRIG, CHOLHDL, LDLDIRECT in the last 72 hours. Thyroid Function Tests No results for input(s): TSH, T4TOTAL, T3FREE, THYROIDAB in the last 72 hours.  Invalid input(s): FREET3  Other results:   Imaging    No results found.   Medications:     Scheduled Medications: . aspirin  81 mg Oral Daily  . atorvastatin  40 mg Oral q1800  . digoxin  0.125 mg Oral Daily  . enoxaparin  (LOVENOX) injection  50 mg Subcutaneous Q24H  . fluticasone  1 spray Each Nare Daily  . insulin aspart  0-20 Units Subcutaneous TID WC  . insulin aspart  0-5 Units Subcutaneous QHS  . mometasone-formoterol  2 puff Inhalation BID  . omega-3 acid ethyl esters  1 g Oral BID  . sacubitril-valsartan  1 tablet Oral BID  . sertraline  100 mg Oral Daily  . sodium chloride flush  10-40 mL Intracatheter Q12H  . sodium chloride flush  3 mL Intravenous Q12H  . sodium chloride flush  3 mL Intravenous Q12H  . spironolactone  25 mg Oral Daily    Infusions: . sodium chloride    . sodium chloride    . amiodarone 30 mg/hr (02/07/18 2215)  . milrinone 0.5 mcg/kg/min (02/08/18 0657)    PRN Medications: sodium chloride, sodium chloride, acetaminophen, albuterol, ALPRAZolam, LORazepam, ondansetron (ZOFRAN) IV, sodium chloride flush, sodium chloride flush, sodium chloride flush, traZODone    Patient Profile   Curtis [name] is a 42 y.o. male with a history of HTN, asthma, HL, and DM.   Admitted for evaluation and treatment of fluid overload.   Assessment/Plan   1. Acute on chronic systolic CHF -> cardiogenic shock: Echo 02/03/18 with EF 15-20%, moderate LV dilation, mildly dilated RV with moderately decreased RV systolic function.  He had coronary angiography this admission with moderate coronary disease, but it does not explain low EF.  Possible viral myocarditis.  He had low output on RHC also with evidence for significant RV failure.  Co-ox up to 72% on milrinone 0.5. CVP stable 3-4. Has diuresed 31 pounds - Co-ox 60.8% this am on milrinone 0.5 mcg/kg/min, will decreased to 0.375.   - Continue digoxin 0.125 mg daily. - Continue Entresto 49/51 mg bid, increase to 97/103 this evening if BP remains stable.   - Continue spironolactone 25 mg daily.  - Diuretics on hold.  - Hold Coreg for now with low output HF.  - Plan cardiac MRI today.  - Blood type is A+.  - Etiology unclear. 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 ? Viral CM. Also had fairly frequent PVCs but not sure if the burden was high enough to cause CM. Await results of cMRI. Continue amio.  - MD has had long discussion with pt and mother concerning possible need for advanced therapies including transfer to [facility] for transplant eval. He is fortunately responding well to medical therapy.  Will continue to titrate meds and wean inotropes as tolerated.   2. CAD: Has strong family history of CAD.  On cath 3/21 he had extensive moderate CAD, but it does not explain his cardiomyopathy.  Most significant appears to be  an 80% stenosis in the mid LCx.  Medical management for the time being.  - No s/s of ischemia. Continue ASA 81 and statin.   3. Bicuspid aortic valve disorder:  - He has moderate aortic insufficiency with a bicuspid aortic valve.  3.9 cm ascending aorta.  No change.  . 4. DM - Continue SSI per primary. A1C 7.8. No change.   - Consider jardiance in the future   5. HL - LDL 44 on 3/20 - Continue statin. No change.   6. Hypokalemia - K 3.9 this am. Gentle supp with PVCs/NSVT.   7. OSA - Desats to 70s overnight requiring 3L [place] while sleeping per RN staff - Will need outpatient sleep study.   8. Frequent PVCs and NSVT - Improving on tele on IV amio while on high-dose milrinone.  - Keep K > 4.0 MG> 2.0. K 3.9 and Mg 2.0 this am.   9. SVT Noted on milrinone, continue amiodarone.   Plan for cMRI today. Continue to titrate meds as tolerated.   Length of Stay: [address]  Luane [employer]  02/08/2018, 7:16 AM  Advanced Heart Failure Team Pager [phone] (M-F; 7a - 4p)  Please contact [facility] for night-coverage after hours (4p -7a ) and weekends on [email or web address]  Patient seen with PA, agree with the above note.    This morning, he feel much better.  Weight down about 31 lbs.  CVP 3-4 with co-ox 61%.  Telemetry with NSR.   On exam, he is not volume overloaded with no JVD.   Regular S1/S2 w/o S3.  CTAB.    Will decrease milrinone to 0.375 today.  If BP remains stable, will increase Entresto to 97/103 bid with evening dose.    He will have cardiac MRI today to look for myocarditis/infiltrative disease.   He remains on amiodarone while on milrinone for SVT, PVCs, NSVT while on milrinone.    Goal now to wean on milrinone and treat medically while waiting for EF hopefully to recover.  If we are not able to titrate off milrinone, will need to be seen in the transplant clinic at [facility] expeditiously.   Marca Ancona 02/08/2018 7:45 AM

## 2018-02-08 NOTE — Progress Notes (Signed)
Heart Failure Navigator Consult Note  Presentation: Per Dr. Horton Chin [facility]  is a 42 y.o. male with a history of HTN, asthma, HL, and DM.   He was in his usual state of health in January and was able to ride 30-40 miles on his bicycle. He was out of losartan/hctz for 1 week during Jan due to the recall and remembers feeling badly until it was restarted. In February, he felt SOB and had a productive cough with clear sputum but thought it was asthma. His PCP started him on a maintenance inhaler and his symptoms resolved. He recalls having some nausea with vomiting mid-February. No fever, diarrhea, or chills.   His symptoms worsened last weekend with SOB on exertion and 20 lb weight gain (over 1 week). He also endorses orthopnea, PND, fatigue, and dizziness. No CP, fever, or chills. He still has productive cough with clear sputum.   He saw his PCP on 3/19, who sent him to [facility] and he was admitted for evaluation and treatment of fluid overload.  He has diuresed 8 lbs with 40 mg IV lasix BID.  Echo yesterday with EF 15-20% with severe LV dilation and grade 1 DD, mod reduced RV function.  R/LHC today with multivessel disease, elevated filling pressures, and severely reduced CO/CI (see below). Transferred to ICU for management of acute systolic HF.   Pertinent admission labs include: Troponin 0.20, Na 141, K 3.7, creatinine 1.20, LDL 44, WBC 13.4, hemoglobin 14.4, A1C 7.8, BNP 1725, UDS negative, TSH 3.2 CXR: mild interstitial edema and bilateral pleural effusions.   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 His parents live in [place]. No issues with transportation or getting medications. Manages own medications. No tobacco, or drugs. Drinks 1 beer/ month. Significant family history of CAD and CHF. Father had a CABG at 67.   Past Medical History:  Diagnosis Date  . Asthma   . CAD (coronary artery disease) 02/04/2018   LHC 02/04/18: multivessel CAD, 50% prox LAD,  80-90% mid LCx at take off of CTO'ed OM branch, 60-70% distal RCA stenosis  . Diabetes mellitus without complication (HCC)   . Hyperlipidemia   . Hypertension   . MRSA (methicillin resistant Staphylococcus aureus) 2014   nose leison MRSA positive  . Systolic heart failure (HCC) 02/03/2018   EF 15-20% on echo 02/03/18    Social History   Socioeconomic History  . Marital status: Single    Spouse name: Not on file  . Number of children: Not on file  . Years of education: Not on file  . Highest education level: Not on file  Occupational History  . Occupation: Curator  Social Needs  . Financial resource strain: Not on file  . Food insecurity:    Worry: Not on file    Inability: Not on file  . Transportation needs:    Medical: Not on file    Non-medical: Not on file  Tobacco Use  . Smoking status: Never Smoker  . Smokeless tobacco: Never Used  Substance and Sexual Activity  . Alcohol use: No    Frequency: Never    Comment: remote occasional use  . 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 Sexual activity: Never    Partners: Female    Comment: last 2 years ago  Lifestyle  . Physical activity:    Days per week: Not on file    Minutes per session: Not  on file  . Stress: Not on file  Relationships  . Social connections:    Talks on phone: Not on file    Gets together: Not on file    Attends religious service: Not on file    Active member of club or organization: Not on file    Attends meetings of clubs or organizations: Not on file    Relationship status: Not on file  Other Topics Concern  . Not on file  Social History Narrative  . Not on file    ECHO:Study Conclusions-02/03/18  - Left ventricle: The cavity size was severely dilated. Systolic   function was severely reduced. The estimated ejection fraction   was in the range of 15% to 20%. Doppler parameters are consistent   with abnormal left ventricular relaxation  (grade 1 diastolic   dysfunction). - Aortic valve: Bicuspid. There was moderate regurgitation. Valve   area (VTI): 0.38 cm^2. Valve area (Vmax): 0.7 cm^2. Valve area   (Vmean): 0.76 cm^2. - Left atrium: The atrium was moderately dilated. - Right ventricle: Systolic function was moderately reduced. - Right atrium: The atrium was severely dilated. - Tricuspid valve: There was mild-moderate regurgitation. - Pulmonary arteries: Systolic pressure was mildly increased. PA   peak pressure: 33 mm Hg (S). - Pericardium, extracardiac: A small pericardial effusion was   identified. There was no evidence of hemodynamic compromise.   Features were not consistent with tamponade physiology.  Impressions:  - The LV is markedly dilated and has severe reduction of LV   function . EF 15-20%.   grade 1 diastolic dysfunction   Bicuspid AV with moderate AI   BNP    Component Value Date/Time   BNP 1,724.6 (H) 02/02/2018 1329    ProBNP No results found for: PROBNP   Education Assessment and Provision:  Detailed education and instructions provided on heart failure disease management including the following:  Signs and symptoms of Heart Failure When to call the physician Importance of daily weights Low sodium diet Fluid restriction Medication management Anticipated future follow-up appointments  Patient education given on each of the above topics.  Patient acknowledges understanding and acceptance of all instructions.  I spoke with patient and parents regarding his new HF diagnosis and current hospitalization.  He tells me that he feels like he is "understanding it all".  He does admit that it is "overwhelming".   He plans to return to his parents home in [place].  I reviewed the importance of daily weights and when to contact the physician.  He does not have a scale but they do say they will get one at discharge.  I reviewed a low sodium diet and high sodium foods to avoid.  He says that  he has been eating low sodium for quite some time.  He denies any issues with getting or taking prescribed medications however he says his prescription plan "is not the best".  I asked him to be in close contact with our clinic regarding his medications.  He will follow in the AHF Clinic and I have given him directions/map.  Education Materials:  "Living Better With Heart Failure" Booklet, Daily Weight Tracker Tool    High Risk Criteria for Readmission and/or Poor Patient Outcomes:  (Recommend Follow-up with Advanced Heart Failure Clinic)--yes   EF <30%- yes 15-20%  2 or more admissions in 6 months- No new diagnosis  Difficult social situation- No  Demonstrates medication noncompliance- Denies    Barriers of Care:  New HF,  Knowledge   Discharge Planning:   Plans to discharge to [facility] [place] with his parents.  I have referred him to HF [facility].  He will benefit with this additional resource for ongoing education with new diagnosis.

## 2018-02-09 LAB — BASIC METABOLIC PANEL
Anion gap: 11 (ref 5–15)
BUN: 7 mg/dL (ref 6–20)
CALCIUM: 8.3 mg/dL — AB (ref 8.9–10.3)
CO2: 24 mmol/L (ref 22–32)
CREATININE: 0.74 mg/dL (ref 0.61–1.24)
Chloride: 99 mmol/L — ABNORMAL LOW (ref 101–111)
GFR calc non Af Amer: >60 mL/min (ref >60)
Glucose, Bld: 322 mg/dL — ABNORMAL HIGH (ref 65–99)
Potassium: 3.9 mmol/L (ref 3.5–5.1)
SODIUM: 134 mmol/L — AB (ref 135–145)

## 2018-02-09 LAB — GLUCOSE, CAPILLARY
GLUCOSE-CAPILLARY: 157 mg/dL — AB (ref 65–99)
GLUCOSE-CAPILLARY: 184 mg/dL — AB (ref 65–99)
Glucose-Capillary: 122 mg/dL — ABNORMAL HIGH (ref 65–99)
Glucose-Capillary: 203 mg/dL — ABNORMAL HIGH (ref 65–99)
Glucose-Capillary: 136 mg/dL — ABNORMAL HIGH (ref 65–99)
Glucose-Capillary: 126 mg/dL — ABNORMAL HIGH (ref 65–99)

## 2018-02-09 LAB — CBC WITH DIFFERENTIAL/PLATELET
BASOS ABS: 0 10*3/uL (ref 0.0–0.1)
BASOS PCT: 0 %
Eosinophils Absolute: 0.2 10*3/uL (ref 0.0–0.7)
Eosinophils Relative: 3 %
HCT: 40.8 % (ref 39.0–52.0)
Hemoglobin: 13.2 g/dL (ref 13.0–17.0)
Lymphocytes Relative: 30 %
Lymphs Abs: 2.1 10*3/uL (ref 0.7–4.0)
MCH: 28 pg (ref 26.0–34.0)
MCHC: 32.4 g/dL (ref 30.0–36.0)
MCV: 86.4 fL (ref 78.0–100.0)
MONO ABS: 0.7 10*3/uL (ref 0.1–1.0)
Monocytes Relative: 10 %
NEUTROS ABS: 3.9 10*3/uL (ref 1.7–7.7)
Neutrophils Relative %: 57 %
PLATELETS: 180 10*3/uL (ref 150–400)
RBC: 4.72 MIL/uL (ref 4.22–5.81)
RDW: 13.6 % (ref 11.5–15.5)
WBC: 7 10*3/uL (ref 4.0–10.5)

## 2018-02-09 LAB — DIGOXIN LEVEL: Digoxin Level: 0.3 ng/mL — ABNORMAL LOW (ref 0.8–2.0)

## 2018-02-09 LAB — COOXEMETRY PANEL
CARBOXYHEMOGLOBIN: 1 % (ref 0.5–1.5)
METHEMOGLOBIN: 1.4 % (ref 0.0–1.5)
O2 Saturation: 61.4 %
Total hemoglobin: 13.8 g/dL (ref 12.0–16.0)

## 2018-02-09 LAB — MAGNESIUM: MAGNESIUM: 2.1 mg/dL (ref 1.7–2.4)

## 2018-02-09 MED ORDER — ATORVASTATIN CALCIUM 80 MG PO TABS
80.0000 mg | ORAL_TABLET | Freq: Every day | ORAL | Status: DC
  Administered 2018-02-09 – 2018-02-11 (×3): 80 mg via ORAL
  Filled 2018-02-09 (×3): qty 1

## 2018-02-09 MED ORDER — MILRINONE LACTATE IN DEXTROSE 20-5 MG/100ML-% IV SOLN
0.1250 ug/kg/min | INTRAVENOUS | Status: DC
  Administered 2018-02-09 (×2): 0.25 ug/kg/min via INTRAVENOUS
  Administered 2018-02-11: 0.125 ug/kg/min via INTRAVENOUS
  Filled 2018-02-09 (×3): qty 100

## 2018-02-09 NOTE — Progress Notes (Addendum)
Advanced Heart Failure Rounding Note  PCP-Cardiologist: No primary care provider on file.   Subjective:    Started on milrinone 0.25 mcg/kg/min 02/05/18 for CI 1.2 on RHC, milrinone increased due to persistently low output.   Coox 61.4% on milrinone 0.375 mcg/kg/min. K 3.9. Cr 0.74. Weight down 8 lbs, 36 lbs total.   Feeling slowly better. Continues to have a lot of UOP. Denies lightheadedness or dizziness. No CP. No palpitations.   Studies:  cMRI 02/08/18 Severely dilated LV with  LVEF 21%, Mildly dilated RV with mildly decreased systolic function. Patchy mid-wall LGE: possible viral myocarditis.  Echo: EF 15-20% with moderate LV dilation, mildly dilated RV with moderately decreased RV systolic function. The patient also has a bicuspid aortic valve with moderate AI.  The ascending aorta is dilated to 3.9 cm.   LHC/RHC (02/05/18): Extensive moderate but not critical CAD.  I would not expect this to have caused his EF to fall so markedly. On RHC, filling pressures were elevated with markedly low cardiac output and elevated SVR.  PAPi was low at 1.4 and CVP/PCWP high at 0.62 suggesting significant co-existing RV dysfunction.  RA 19 PA 60/40 (47) PAWP 35 PA sat 42% Ao sat 99% Fick CO 2.9 Fick CI 1.2  Objective:   Weight Range: 223 lb 14.4 oz (101.6 kg) Body mass index is 27.25 kg/m.   Vital Signs:   Temp:  [97.5 F (36.4 C)-98.3 F (36.8 C)] 98.2 F (36.8 C) (03/26 0354) Pulse Rate:  [79-99] 79 (03/26 0612) Resp:  [14-25] 16 (03/26 0612) BP: (107-137)/(71-94) 112/75 (03/26 0612) SpO2:  [93 %-100 %] 98 % (03/26 0612) Weight:  [223 lb 14.4 oz (101.6 kg)] 223 lb 14.4 oz (101.6 kg) (03/26 0500) Last BM Date: 02/08/18  Weight change: Filed Weights   02/07/18 0500 02/08/18 0408 02/09/18 0500  Weight: 234 lb 5.6 oz (106.3 kg) 231 lb 8 oz (105 kg) 223 lb 14.4 oz (101.6 kg)    Intake/Output:   Intake/Output Summary (Last 24 hours) at 02/09/2018 0718 Last data filed at  02/09/2018 0600 Gross per 24 hour  Intake 1245.9 ml  Output 3470 ml  Net -2224.1 ml     Physical Exam   General: Well appearing. No resp difficulty. HEENT: Normal Neck: Supple. JVP not elevated.. Carotids 2+ bilat; no bruits. No thyromegaly or nodule noted. Cor: PMI nondisplaced. RRR, +S3.  Lungs: CTAB, normal effort. Abdomen: Soft, non-tender, non-distended, no HSM. No bruits or masses. +BS  Extremities: No cyanosis, clubbing, or rash. R and LLE no edema. RUE PICC Neuro: Alert & orientedx3, cranial nerves grossly intact. moves all 4 extremities w/o difficulty. Affect pleasant   Telemetry   NSR, 80-90s, occasional PVCs, personally reviewed.   EKG    No new tracings.    Labs    CBC Recent Labs    02/08/18 0400 02/09/18 0438  WBC 7.4 7.0  NEUTROABS 4.3 3.9  HGB 13.5 13.2  HCT 40.8 40.8  MCV 85.4 86.4  PLT 182 180   Basic Metabolic Panel Recent Labs    16/10/96 0314 02/08/18 0400 02/09/18 0438  NA 135 134* 134*  K 4.1 3.9 3.9  CL 99* 101 99*  CO2 26 24 24   GLUCOSE 229* 247* 322*  BUN 13 10 7   CREATININE 0.92 0.84 0.74  CALCIUM 8.4* 8.2* 8.3*  MG 1.8 2.0  --    Liver Function Tests No results for input(s): AST, ALT, ALKPHOS, BILITOT, PROT, ALBUMIN in the last 72 hours. No  results for input(s): LIPASE, AMYLASE in the last 72 hours. Cardiac Enzymes No results for input(s): CKTOTAL, CKMB, CKMBINDEX, TROPONINI in the last 72 hours.  BNP: BNP (last 3 results) Recent Labs    02/02/18 1329  BNP 1,724.6*    ProBNP (last 3 results) No results for input(s): PROBNP in the last 8760 hours.   D-Dimer No results for input(s): DDIMER in the last 72 hours. Hemoglobin A1C No results for input(s): HGBA1C in the last 72 hours. Fasting Lipid Panel No results for input(s): CHOL, HDL, LDLCALC, TRIG, CHOLHDL, LDLDIRECT in the last 72 hours. Thyroid Function Tests No results for input(s): TSH, T4TOTAL, T3FREE, THYROIDAB in the last 72 hours.  Invalid input(s):  FREET3  Other results:   Imaging    Mr Cardiac Morphology W Wo Contrast  Result Date: 02/08/2018 CLINICAL DATA:  Cardiomyopathy of uncertain etiology. EXAM: CARDIAC MRI TECHNIQUE: The patient was scanned on a 1.5 Tesla GE magnet. A dedicated cardiac coil was used. Functional imaging was done using Fiesta sequences. 2,3, and 4 chamber views were done to assess for RWMA's. Modified Simpson's rule using a short axis stack was used to calculate an ejection fraction on a dedicated work [employer]. The patient received 38 cc of Multihance. After 10 minutes inversion recovery sequences were used to assess for infiltration and scar tissue. CONTRAST:  38 cc Multihance FINDINGS: Moderate right pleural effusion.  Small pericardial effusion. The left ventricle was severely dilated with severe diffuse hypokinesis, EF 21%. Mildly dilated right ventricle with mildly decreased systolic function. Moderate to severely dilated left atrium. Moderately dilated right atrium. Bicuspid aortic valve without significant stenosis. There was aortic insufficiency that was not fully visualized. However, regurgitant fraction 8% by flow imaging suggests only mild aortic insufficiency. No significant mitral regurgitation was noted. Delayed enhancement images are very difficult to interpret due to significant respiratory artifact. However, there does appear to be patchy mid-wall late gadolinium enhancement (LGE) in the septum. The other walls were not well-visualized. Measurements: LVEDV 408 mL LVSV 85 mL LVEF 21% IMPRESSION: 1.  Severely dilated LV with EF 21%, diffuse hypokinesis. 2.  Mildly dilated RV with mildly decreased systolic function. 3. Difficult delayed enhancement images. However, the septum was visualized and showed patchy mid-wall LGE. This is suggestive of possible viral myocarditis. Dalton Mclean Electronically Signed   By: Marca Ancona M.D.   On: 02/08/2018 23:42     Medications:     Scheduled  Medications: . aspirin  81 mg Oral Daily  . atorvastatin  40 mg Oral q1800  . digoxin  0.125 mg Oral Daily  . enoxaparin (LOVENOX) injection  50 mg Subcutaneous Q24H  . fluticasone  1 spray Each Nare Daily  . insulin aspart  0-20 Units Subcutaneous TID WC  . insulin aspart  0-5 Units Subcutaneous QHS  . mometasone-formoterol  2 puff Inhalation BID  . omega-3 acid ethyl esters  1 g Oral BID  . sacubitril-valsartan  1 tablet Oral BID  . sertraline  100 mg Oral Daily  . sodium chloride flush  10-40 mL Intracatheter Q12H  . sodium chloride flush  3 mL Intravenous Q12H  . sodium chloride flush  3 mL Intravenous Q12H  . spironolactone  25 mg Oral Daily    Infusions: . sodium chloride    . sodium chloride    . amiodarone 30 mg/hr (02/08/18 2356)  . milrinone 0.375 mcg/kg/min (02/09/18 0543)    PRN Medications: sodium chloride, sodium chloride, acetaminophen, albuterol, ALPRAZolam, LORazepam, ondansetron (  ZOFRAN) IV, sodium chloride flush, sodium chloride flush, sodium chloride flush, traZODone    Patient Profile   Curtis [name] is a 42 y.o. male with a history of HTN, asthma, HL, and DM.   Admitted for evaluation and treatment of fluid overload.   Assessment/Plan   1. Acute on chronic systolic CHF -> cardiogenic shock: Echo 02/03/18 with EF 15-20%, moderate LV dilation, mildly dilated RV with moderately decreased RV systolic function.  He had coronary angiography this admission with moderate coronary disease, but it does not explain low EF.  Possible viral myocarditis.  He had low output on RHC also with evidence for significant RV failure.   - Coox 61.4% this am on milrinone 0.375 mcg/kg/min. Will decrease to 0.25 mcg/kg/min and follow.  - Diuretics on hold. He is down 36 lbs total.  - Continue digoxin 0.125 mg daily. - Continue Entresto 97/103 mg BID.  - Continue spironolactone 25 mg daily.  - Hold Coreg for now with low output HF.  - Blood type is A+.  - cMRI 02/08/18.  LVEF 21% with mildly reduced RV function. 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  - MD has had long discussion with pt and mother concerning possible need for advanced therapies including transfer to [facility] for transplant eval. He is fortunately responding well to medical therapy.  Will continue to titrate meds and wean inotropes as tolerated.   2. CAD: Has strong family history of CAD.  On cath 3/21 he had extensive moderate CAD, but it does not explain his cardiomyopathy.  Most significant appears to be an 80% stenosis in the mid LCx.  Medical management for the time being.  - No s/s of ischemia. Continue ASA 81 and statin.   3. Bicuspid aortic valve disorder:  - He has moderate aortic insufficiency with a bicuspid aortic valve.  3.9 cm ascending aorta.  No change. . 4. DM - Continue SSI. A1C 7.8. No change. - Consider jardiance in the future   5. HL - LDL 44 on 3/20 - Continue statin. No change.   6. Hypokalemia - K 3.9 this am. Gentle supp with SVT/NSVT.   7. OSA - Desats to 70s overnight requiring 3L [place] while sleeping per RN staff - Will need outpatient sleep study.   8. Frequent PVCs and NSVT - Improving on tele on IV amio while on high-dose milrinone.  - Keep K > 4.0 MG> 2.0. K 3.9. Mg pending today.   9. SVT - Noted on milrinone. Continue amiodarone.   Continue milrinone wean. Likely OK for stepdown.   Length of Stay: [address]  Graciella Freer, [place]  02/09/2018, 7:18 AM  Advanced Heart Failure Team Pager [phone] (M-F; 7a - 4p)  Please contact [facility] for night-coverage after hours (4p -7a ) and weekends on [email or web address]  Patient seen with PA, agree with the above note.    Weight continues to come down, CVP 2, co-ox 61%.  Now off diuretics. Telemetry with NSR.   On exam, he is not volume overloaded with no JVD.  Regular S1/S2 w/o S3.  CTAB.    Will decrease milrinone to 0.25 today.  Continue current Entresto, digoxin, and spironolactone.   Cardiac MRI was a difficult  study due to respiratory artifact, but there does appear to be mid-wall LGE in the septum that appears suggestive of viral myocarditis.   He remains on amiodarone while on milrinone for SVT, PVCs, NSVT while on milrinone.    At discharge, would ideally treat diabetes with empagliflozin (replacing glimepiride)  and metformin.   Goal now to wean off milrinone and treat medically while waiting for EF hopefully to recover.  If we are not able to titrate off milrinone, will need to be seen in the transplant clinic at [facility] expeditiously.   Marca Ancona 02/09/2018 7:51 AM

## 2018-02-10 LAB — BASIC METABOLIC PANEL
ANION GAP: 10 (ref 5–15)
BUN: 10 mg/dL (ref 6–20)
CALCIUM: 8.8 mg/dL — AB (ref 8.9–10.3)
CO2: 23 mmol/L (ref 22–32)
Chloride: 104 mmol/L (ref 101–111)
Creatinine, Ser: 0.71 mg/dL (ref 0.61–1.24)
GFR calc Af Amer: >60 mL/min (ref >60)
GLUCOSE: 191 mg/dL — AB (ref 65–99)
Potassium: 4.1 mmol/L (ref 3.5–5.1)
Sodium: 137 mmol/L (ref 135–145)

## 2018-02-10 LAB — CBC WITH DIFFERENTIAL/PLATELET
BASOS PCT: 0 %
Basophils Absolute: 0 10*3/uL (ref 0.0–0.1)
EOS ABS: 0.3 10*3/uL (ref 0.0–0.7)
EOS PCT: 3 %
HCT: 43.3 % (ref 39.0–52.0)
Hemoglobin: 14 g/dL (ref 13.0–17.0)
LYMPHS PCT: 30 %
Lymphs Abs: 2.7 10*3/uL (ref 0.7–4.0)
MCH: 28 pg (ref 26.0–34.0)
MCHC: 32.3 g/dL (ref 30.0–36.0)
MCV: 86.6 fL (ref 78.0–100.0)
MONO ABS: 0.8 10*3/uL (ref 0.1–1.0)
Monocytes Relative: 9 %
Neutro Abs: 5.1 10*3/uL (ref 1.7–7.7)
Neutrophils Relative %: 58 %
PLATELETS: 187 10*3/uL (ref 150–400)
RBC: 5 MIL/uL (ref 4.22–5.81)
RDW: 13.9 % (ref 11.5–15.5)
WBC: 8.9 10*3/uL (ref 4.0–10.5)

## 2018-02-10 LAB — GLUCOSE, CAPILLARY
Glucose-Capillary: 150 mg/dL — ABNORMAL HIGH (ref 65–99)
Glucose-Capillary: 132 mg/dL — ABNORMAL HIGH (ref 65–99)
Glucose-Capillary: 198 mg/dL — ABNORMAL HIGH (ref 65–99)
Glucose-Capillary: 115 mg/dL — ABNORMAL HIGH (ref 65–99)
Glucose-Capillary: 182 mg/dL — ABNORMAL HIGH (ref 65–99)

## 2018-02-10 LAB — COOXEMETRY PANEL
CARBOXYHEMOGLOBIN: 1.1 % (ref 0.5–1.5)
CARBOXYHEMOGLOBIN: 1.3 % (ref 0.5–1.5)
Carboxyhemoglobin: 1 % (ref 0.5–1.5)
Methemoglobin: 1.3 % (ref 0.0–1.5)
Methemoglobin: 1.1 % (ref 0.0–1.5)
Methemoglobin: 0.8 % (ref 0.0–1.5)
O2 SAT: 55 %
O2 Saturation: 76.4 %
O2 Saturation: 62.6 %
TOTAL HEMOGLOBIN: 12.7 g/dL (ref 12.0–16.0)
TOTAL HEMOGLOBIN: 14.2 g/dL (ref 12.0–16.0)
Total hemoglobin: 14.7 g/dL (ref 12.0–16.0)

## 2018-02-10 NOTE — Progress Notes (Addendum)
Pt has been walking numerous laps. All education has been done and HF Navigator has seen as well. Will sign off. Ethelda Chick CES, ACSM 2:58 PM 02/10/2018

## 2018-02-10 NOTE — Plan of Care (Signed)
  Problem: Elimination: Goal: Will not experience complications related to bowel motility Outcome: Progressing Note:  BM today. Positive flatus.    Problem: Safety: Goal: Ability to remain free from injury will improve Outcome: Progressing   Problem: Skin Integrity: Goal: Risk for impaired skin integrity will decrease Outcome: Progressing   Problem: Education: Goal: Ability to verbalize understanding of medication therapies will improve Outcome: Progressing   Problem: Activity: Goal: Capacity to carry out activities will improve Outcome: Progressing Note:  Pt ambulates around the multiple times a day without any difficulty.

## 2018-02-10 NOTE — Progress Notes (Addendum)
Advanced Heart Failure Rounding Note  PCP-Cardiologist: No primary care provider on file.   Subjective:    Started on milrinone 0.25 mcg/kg/min 02/05/18 for CI 1.2 on RHC, milrinone increased due to persistently low output.   Coox pending on milrinone 0.25 mcg/kg/min. K 4.1 and Cr 0.71. milrinone 0.375 mcg/kg/min. K 3.9. Cr 0.74. Weight stable.  CVP 2-3  Feeling better. Denies SOB, lightheadedness, or dizziness walking unit. Continues to note significant UOP.    Studies:  cMRI 02/08/18 Severely dilated LV with  LVEF 21%, Mildly dilated RV with mildly decreased systolic function. Patchy mid-wall LGE: possible viral myocarditis.  Echo: EF 15-20% with moderate LV dilation, mildly dilated RV with moderately decreased RV systolic function. The patient also has a bicuspid aortic valve with moderate AI.  The ascending aorta is dilated to 3.9 cm.   LHC/RHC (02/05/18): Extensive moderate but not critical CAD.  I would not expect this to have caused his EF to fall so markedly. On RHC, filling pressures were elevated with markedly low cardiac output and elevated SVR.  PAPi was low at 1.4 and CVP/PCWP high at 0.62 suggesting significant co-existing RV dysfunction.  RA 19 PA 60/40 (47) PAWP 35 PA sat 42% Ao sat 99% Fick CO 2.9 Fick CI 1.2  Objective:   Weight Range: 223 lb 5.2 oz (101.3 kg) Body mass index is 27.18 kg/m.   Vital Signs:   Temp:  [98.2 F (36.8 C)-98.8 F (37.1 C)] 98.8 F (37.1 C) (03/27 0400) Pulse Rate:  [84-97] 92 (03/27 0700) Resp:  [18-25] 22 (03/27 0700) BP: (122-137)/(67-93) 131/93 (03/27 0700) SpO2:  [94 %-100 %] 94 % (03/27 0700) Weight:  [223 lb 5.2 oz (101.3 kg)] 223 lb 5.2 oz (101.3 kg) (03/27 0500) Last BM Date: 02/08/18  Weight change: Filed Weights   02/08/18 0408 02/09/18 0500 02/10/18 0500  Weight: 231 lb 8 oz (105 kg) 223 lb 14.4 oz (101.6 kg) 223 lb 5.2 oz (101.3 kg)    Intake/Output:   Intake/Output Summary (Last 24 hours) at 02/10/2018  0721 Last data filed at 02/09/2018 2000 Gross per 24 hour  Intake 1095.9 ml  Output 950 ml  Net 145.9 ml     Physical Exam   General: Well appearing. No resp difficulty. HEENT: Normal Neck: Supple. JVP not elevated.. Carotids 2+ bilat; no bruits. No thyromegaly or nodule noted. Cor: PMI nondisplaced. RRR, +S3.  Lungs: CTAB, normal effort. Abdomen: Soft, non-tender, non-distended, no HSM. No bruits or masses. +BS  Extremities: No cyanosis, clubbing, or rash. R and LLE no edema. RUE PICC Neuro: Alert & orientedx3, cranial nerves grossly intact. moves all 4 extremities w/o difficulty. Affect pleasant   Telemetry   NSR, 80-90s, occasional PVCs, personally reviewed.   EKG    No new tracings.    Labs    CBC Recent Labs    02/09/18 0438 02/10/18 0458  WBC 7.0 8.9  NEUTROABS 3.9 5.1  HGB 13.2 14.0  HCT 40.8 43.3  MCV 86.4 86.6  PLT 180 187   Basic Metabolic Panel Recent Labs    40/98/11 0400 02/09/18 0438 02/10/18 0458  NA 134* 134* 137  K 3.9 3.9 4.1  CL 101 99* 104  CO2 24 24 23   GLUCOSE 247* 322* 191*  BUN 10 7 10   CREATININE 0.84 0.74 0.71  CALCIUM 8.2* 8.3* 8.8*  MG 2.0 2.1  --    Liver Function Tests No results for input(s): AST, ALT, ALKPHOS, BILITOT, PROT, ALBUMIN in the last 72  hours. No results for input(s): LIPASE, AMYLASE in the last 72 hours. Cardiac Enzymes No results for input(s): CKTOTAL, CKMB, CKMBINDEX, TROPONINI in the last 72 hours.  BNP: BNP (last 3 results) Recent Labs    02/02/18 1329  BNP 1,724.6*    ProBNP (last 3 results) No results for input(s): PROBNP in the last 8760 hours.   D-Dimer No results for input(s): DDIMER in the last 72 hours. Hemoglobin A1C No results for input(s): HGBA1C in the last 72 hours. Fasting Lipid Panel No results for input(s): CHOL, HDL, LDLCALC, TRIG, CHOLHDL, LDLDIRECT in the last 72 hours. Thyroid Function Tests No results for input(s): TSH, T4TOTAL, T3FREE, THYROIDAB in the last 72  hours.  Invalid input(s): FREET3  Other results:   Imaging    No results found.   Medications:     Scheduled Medications: . aspirin  81 mg Oral Daily  . atorvastatin  80 mg Oral q1800  . digoxin  0.125 mg Oral Daily  . enoxaparin (LOVENOX) injection  50 mg Subcutaneous Q24H  . fluticasone  1 spray Each Nare Daily  . insulin aspart  0-20 Units Subcutaneous TID WC  . insulin aspart  0-5 Units Subcutaneous QHS  . mometasone-formoterol  2 puff Inhalation BID  . omega-3 acid ethyl esters  1 g Oral BID  . sacubitril-valsartan  1 tablet Oral BID  . sertraline  100 mg Oral Daily  . sodium chloride flush  10-40 mL Intracatheter Q12H  . sodium chloride flush  3 mL Intravenous Q12H  . sodium chloride flush  3 mL Intravenous Q12H  . spironolactone  25 mg Oral Daily    Infusions: . sodium chloride    . sodium chloride    . amiodarone 30 mg/hr (02/09/18 2235)  . milrinone 0.25 mcg/kg/min (02/09/18 1902)    PRN Medications: sodium chloride, sodium chloride, acetaminophen, albuterol, ALPRAZolam, LORazepam, ondansetron (ZOFRAN) IV, sodium chloride flush, sodium chloride flush, sodium chloride flush, traZODone    Patient Profile   Curtis [name] is a 42 y.o. male with a history of HTN, asthma, HL, and DM.   Admitted for evaluation and treatment of fluid overload.   Assessment/Plan   1. Acute on chronic systolic CHF -> cardiogenic shock: Echo 02/03/18 with EF 15-20%, moderate LV dilation, mildly dilated RV with moderately decreased RV systolic function.  He had coronary angiography this admission with moderate coronary disease, but it does not explain low EF.  Possible viral myocarditis.  He had low output on RHC also with evidence for significant RV failure.   - Coox pending this am on milrinone 0.25 mcg/kg/min. If stable will continue wean.  - Diuretics on hold. He is down 36 lbs total.  - Continue digoxin 0.125 mg daily. - Continue Entresto 97/103 mg BID.  - Continue  spironolactone 25 mg daily.  - Hold Coreg for now with low output HF.  - Blood type is A+.  - cMRI 02/08/18. LVEF 21% with mildly reduced RV function. 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  - MD has had long discussion with pt and mother concerning possible need for advanced therapies including transfer to [facility] for transplant eval. He is fortunately responding well to medical therapy.  Will continue to titrate meds and wean inotropes as tolerated.   2. CAD: Has strong family history of CAD.  On cath 3/21 he had extensive moderate CAD, but it does not explain his cardiomyopathy.  Most significant appears to be an 80% stenosis in the mid LCx.  Medical management for the time  being.  - No s/s of ischemia. Continue ASA 81 and statin.   3. Bicuspid aortic valve disorder:  - He has moderate aortic insufficiency with a bicuspid aortic valve.  3.9 cm ascending aorta. No change. . 4. DM - Continue SSI. A1C 7.8. No change. - Plan to d/c on metformin and Jardiance (No amaryl) - Consider jardiance in the future  5. HL - LDL 44 on 3/20 - Continue statin. No change.   6. Hypokalemia - K 4.1 this am.  Gentle supp with SVT/NSVT.   7. OSA - Desats to 70s overnight requiring 3L [place] while sleeping per RN staff - Will need outpatient sleep study. No change.   8. Frequent PVCs and NSVT - Improving on tele on IV amio while on high-dose milrinone.  - Keep K > 4.0 MG> 2.0. Stable today.   9. SVT - Noted on milrinone. Continue amiodarone. Quiescent.   Length of Stay: [address]  Graciella Freer, [place]  02/10/2018, 7:21 AM  Advanced Heart Failure Team Pager [phone] (M-F; 7a - 4p)  Please contact [facility] for night-coverage after hours (4p -7a ) and weekends on [email or web address]  Patient seen with PA, agree with the above note.   Weight continues to come down, CVP 2-3, co-ox 63%. Now off diuretics. Telemetry with NSR.   On exam, he is not volume overloaded with no JVD. Regular S1/S2 w/o S3. CTAB.   Will  decrease milrinone to 0.125, repeat co-ox this afternoon and likely stop later today. Continue current Entresto, digoxin, and spironolactone. Eventually will need po Lasix but not yet.   Cardiac MRI was a difficult study due to respiratory artifact, but there does appear to be mid-wall LGE in the septum that appears suggestive of viral myocarditis.   He remains on amiodarone while on milrinone for SVT, PVCs, NSVT while on milrinone. Will stop amiodarone and try to start low dose Coreg when he is off milrinone.   At discharge, would ideally treat diabetes with empagliflozin (replacing glimepiride) and metformin.   Goal now to wean off milrinone and treat medically while waiting for EF hopefully to recover. If we are not able to titrate off milrinone, will need to be seen in the transplant clinic at [facility] expeditiously.   Marca Ancona 02/10/2018 7:54 AM

## 2018-02-11 ENCOUNTER — Telehealth (HOSPITAL_COMMUNITY): Payer: Self-pay

## 2018-02-11 LAB — CBC WITH DIFFERENTIAL/PLATELET
Basophils Absolute: 0 10*3/uL (ref 0.0–0.1)
Basophils Relative: 0 %
EOS PCT: 2 %
Eosinophils Absolute: 0.2 10*3/uL (ref 0.0–0.7)
HEMATOCRIT: 42.8 % (ref 39.0–52.0)
Hemoglobin: 13.7 g/dL (ref 13.0–17.0)
LYMPHS PCT: 35 %
Lymphs Abs: 2.9 10*3/uL (ref 0.7–4.0)
MCH: 27.8 pg (ref 26.0–34.0)
MCHC: 32 g/dL (ref 30.0–36.0)
MCV: 86.8 fL (ref 78.0–100.0)
MONO ABS: 0.7 10*3/uL (ref 0.1–1.0)
Monocytes Relative: 8 %
NEUTROS ABS: 4.6 10*3/uL (ref 1.7–7.7)
Neutrophils Relative %: 55 %
PLATELETS: 185 10*3/uL (ref 150–400)
RBC: 4.93 MIL/uL (ref 4.22–5.81)
RDW: 14 % (ref 11.5–15.5)
WBC: 8.3 10*3/uL (ref 4.0–10.5)

## 2018-02-11 LAB — BASIC METABOLIC PANEL
ANION GAP: 7 (ref 5–15)
BUN: 12 mg/dL (ref 6–20)
CO2: 25 mmol/L (ref 22–32)
Calcium: 8.6 mg/dL — ABNORMAL LOW (ref 8.9–10.3)
Chloride: 105 mmol/L (ref 101–111)
Creatinine, Ser: 0.78 mg/dL (ref 0.61–1.24)
GFR calc Af Amer: >60 mL/min (ref >60)
GLUCOSE: 171 mg/dL — AB (ref 65–99)
POTASSIUM: 4 mmol/L (ref 3.5–5.1)
Sodium: 137 mmol/L (ref 135–145)

## 2018-02-11 LAB — GLUCOSE, CAPILLARY
Glucose-Capillary: 126 mg/dL — ABNORMAL HIGH (ref 65–99)
Glucose-Capillary: 237 mg/dL — ABNORMAL HIGH (ref 65–99)
Glucose-Capillary: 196 mg/dL — ABNORMAL HIGH (ref 65–99)
Glucose-Capillary: 140 mg/dL — ABNORMAL HIGH (ref 65–99)
Glucose-Capillary: 114 mg/dL — ABNORMAL HIGH (ref 65–99)

## 2018-02-11 LAB — COOXEMETRY PANEL
CARBOXYHEMOGLOBIN: 0.9 % (ref 0.5–1.5)
METHEMOGLOBIN: 1.4 % (ref 0.0–1.5)
O2 Saturation: 59.7 %
TOTAL HEMOGLOBIN: 14.1 g/dL (ref 12.0–16.0)

## 2018-02-11 MED ORDER — CARVEDILOL 3.125 MG PO TABS
3.1250 mg | ORAL_TABLET | Freq: Two times a day (BID) | ORAL | Status: DC
  Administered 2018-02-11 – 2018-02-12 (×2): 3.125 mg via ORAL
  Filled 2018-02-11 (×2): qty 1

## 2018-02-11 NOTE — Progress Notes (Signed)
Patient ID: Curtis [name], male   DOB: October 29, 1976, 42 y.o.   MRN: [id number]     Advanced Heart Failure Rounding Note  PCP-Cardiologist: No primary care provider on file.   Subjective:    Started on milrinone 0.25 mcg/kg/min 02/05/18 for CI 1.2 on RHC, milrinone increased due to persistently low output.   Over the last couple days, we have titrated down milrinone.  Currently on 0.125 with co-ox 60% and CVP 4.  Weight down another 2 lbs.   Walking in halls without dyspnea.   Studies:  cMRI 02/08/18 Severely dilated LV with  LVEF 21%, Mildly dilated RV with mildly decreased systolic function. Patchy mid-wall LGE: possible viral myocarditis.  Echo: EF 15-20% with moderate LV dilation, mildly dilated RV with moderately decreased RV systolic function. The patient also has a bicuspid aortic valve with moderate AI.  The ascending aorta is dilated to 3.9 cm.   LHC/RHC (02/05/18): Extensive moderate but not critical CAD.  I would not expect this to have caused his EF to fall so markedly. On RHC, filling pressures were elevated with markedly low cardiac output and elevated SVR.  PAPi was low at 1.4 and CVP/PCWP high at 0.62 suggesting significant co-existing RV dysfunction.  RA 19 PA 60/40 (47) PAWP 35 PA sat 42% Ao sat 99% Fick CO 2.9 Fick CI 1.2  Objective:   Weight Range: 221 lb 5.5 oz (100.4 kg) Body mass index is 26.94 kg/m.   Vital Signs:   Temp:  [98.4 F (36.9 C)-99.1 F (37.3 C)] 98.8 F (37.1 C) (03/28 0730) Pulse Rate:  [80-100] 86 (03/28 0700) Resp:  [9-32] 22 (03/28 0700) BP: (117-147)/(72-91) 127/87 (03/28 0700) SpO2:  [93 %-100 %] 99 % (03/28 0700) Weight:  [221 lb 5.5 oz (100.4 kg)] 221 lb 5.5 oz (100.4 kg) (03/28 0500) Last BM Date: 02/10/18  Weight change: Filed Weights   02/09/18 0500 02/10/18 0500 02/11/18 0500  Weight: 223 lb 14.4 oz (101.6 kg) 223 lb 5.2 oz (101.3 kg) 221 lb 5.5 oz (100.4 kg)    Intake/Output:   Intake/Output Summary (Last 24 hours) at  02/11/2018 0811 Last data filed at 02/11/2018 0700 Gross per 24 hour  Intake 1431.5 ml  Output 1900 ml  Net -468.5 ml     Physical Exam   General: NAD Neck: No JVD, no thyromegaly or thyroid nodule.  Lungs: Clear to auscultation bilaterally with normal respiratory effort. CV: Nondisplaced PMI.  Heart regular S1/S2, no S3/S4, 1/6 SEM RUSB.  No peripheral edema.   Abdomen: Soft, nontender, no hepatosplenomegaly, no distention.  Skin: Intact without lesions or rashes.  Neurologic: Alert and oriented x 3.  Psych: Normal affect. Extremities: No clubbing or cyanosis.  HEENT: Normal.    Telemetry   NSR 80s, personally reviewed.   EKG    No new tracings.    Labs    CBC Recent Labs    02/10/18 0458 02/11/18 0352  WBC 8.9 8.3  NEUTROABS 5.1 4.6  HGB 14.0 13.7  HCT 43.3 42.8  MCV 86.6 86.8  PLT 187 185   Basic Metabolic Panel Recent Labs    12/45/80 0438 02/10/18 0458 02/11/18 0352  NA 134* 137 137  K 3.9 4.1 4.0  CL 99* 104 105  CO2 24 23 25   GLUCOSE 322* 191* 171*  BUN 7 10 12   CREATININE 0.74 0.71 0.78  CALCIUM 8.3* 8.8* 8.6*  MG 2.1  --   --    Liver Function Tests No results for input(s): AST,  ALT, ALKPHOS, BILITOT, PROT, ALBUMIN in the last 72 hours. No results for input(s): LIPASE, AMYLASE in the last 72 hours. Cardiac Enzymes No results for input(s): CKTOTAL, CKMB, CKMBINDEX, TROPONINI in the last 72 hours.  BNP: BNP (last 3 results) Recent Labs    02/02/18 1329  BNP 1,724.6*    ProBNP (last 3 results) No results for input(s): PROBNP in the last 8760 hours.   D-Dimer No results for input(s): DDIMER in the last 72 hours. Hemoglobin A1C No results for input(s): HGBA1C in the last 72 hours. Fasting Lipid Panel No results for input(s): CHOL, HDL, LDLCALC, TRIG, CHOLHDL, LDLDIRECT in the last 72 hours. Thyroid Function Tests No results for input(s): TSH, T4TOTAL, T3FREE, THYROIDAB in the last 72 hours.  Invalid input(s): FREET3  Other  results:   Imaging    No results found.   Medications:     Scheduled Medications: . aspirin  81 mg Oral Daily  . atorvastatin  80 mg Oral q1800  . carvedilol  3.125 mg Oral BID WC  . digoxin  0.125 mg Oral Daily  . enoxaparin (LOVENOX) injection  50 mg Subcutaneous Q24H  . fluticasone  1 spray Each Nare Daily  . insulin aspart  0-20 Units Subcutaneous TID WC  . insulin aspart  0-5 Units Subcutaneous QHS  . mometasone-formoterol  2 puff Inhalation BID  . omega-3 acid ethyl esters  1 g Oral BID  . sacubitril-valsartan  1 tablet Oral BID  . sertraline  100 mg Oral Daily  . sodium chloride flush  10-40 mL Intracatheter Q12H  . sodium chloride flush  3 mL Intravenous Q12H  . spironolactone  25 mg Oral Daily    Infusions: . sodium chloride      PRN Medications: sodium chloride, acetaminophen, albuterol, ALPRAZolam, LORazepam, ondansetron (ZOFRAN) IV, sodium chloride flush, sodium chloride flush, traZODone    Patient Profile   Curtis [name] is a 42 y.o. male with a history of HTN, asthma, HL, and DM.   Admitted for evaluation and treatment of fluid overload.   Assessment/Plan   1. Acute on chronic systolic CHF -> cardiogenic shock: Echo 02/03/18 with EF 15-20%, moderate LV dilation, mildly dilated RV with moderately decreased RV systolic function.  He had coronary angiography this admission with moderate coronary disease, but it does not explain low EF.  Possible viral myocarditis based on cardiac MRI but difficult study.  He had low output on RHC also with evidence for significant RV failure.  Co-ox now 59% on milrinone 0.125, CVP 4. He is down 38 lbs total.  - Stop milrinone today.   - Does not need Lasix today.  - Continue digoxin 0.125 mg daily. - Continue Entresto 97/103 mg BID.  - Continue spironolactone 25 mg daily.  - Will add Coreg 3.125 mg bid this evening.  - Blood type is A+.   - We have discussed possible need for advanced therapies including transfer  to [facility] for transplant eval. He is fortunately responding well to medical therapy.  Will continue to titrate meds. 2. CAD: Has strong family history of CAD.  On cath 3/21 he had extensive moderate CAD, but it does not explain his cardiomyopathy.  Most significant appears to be an 80% stenosis in the mid LCx.  Medical management for the time being. No chest pain.  - Continue ASA 81 and statin.  3. Bicuspid aortic valve disorder:  He has moderate aortic insufficiency with a bicuspid aortic valve.  3.9 cm ascending aorta. No change. 4.  DM: Continue SSI. A1C 7.8. No change. - Plan to d/c on metformin and Jardiance (No amaryl) 5. HL: LDL 44 on 3/20 - Continue statin. No change.  6. OSA: Will need outpatient sleep study.  7. Frequent PVCs and NSVT: Less with decreased milrinone.  When milrinone is stopped, will stop amiodarone.  Can start Coreg this evening.  8. SVT: Noted on milrinone. Stop amiodarone when off milrinone.    Length of Stay: 9  Marca Ancona, MD  02/11/2018, 8:11 AM  Advanced Heart Failure Team Pager [phone] (M-F; 7a - 4p)  Please contact [facility] for night-coverage after hours (4p -7a ) and weekends on [email or web address]

## 2018-02-11 NOTE — Telephone Encounter (Signed)
Patients insurance is through a [employer] and does not cover services. Will call patient once discharged from hospital to offer Maintenance program. Will continue to follow.

## 2018-02-11 NOTE — Telephone Encounter (Signed)
I attempted to call Mr [facility] today to set up an appointment. The number provided on the referral form was invalid so I will try and go see him before he is discharged from the hospital.

## 2018-02-12 LAB — CBC WITH DIFFERENTIAL/PLATELET
Basophils Absolute: 0 10*3/uL (ref 0.0–0.1)
Basophils Relative: 0 %
EOS ABS: 0.3 10*3/uL (ref 0.0–0.7)
Eosinophils Relative: 4 %
HEMATOCRIT: 42.5 % (ref 39.0–52.0)
HEMOGLOBIN: 13.6 g/dL (ref 13.0–17.0)
LYMPHS ABS: 2.8 10*3/uL (ref 0.7–4.0)
Lymphocytes Relative: 36 %
MCH: 27.9 pg (ref 26.0–34.0)
MCHC: 32 g/dL (ref 30.0–36.0)
MCV: 87.3 fL (ref 78.0–100.0)
Monocytes Absolute: 0.8 10*3/uL (ref 0.1–1.0)
Monocytes Relative: 10 %
NEUTROS ABS: 3.9 10*3/uL (ref 1.7–7.7)
NEUTROS PCT: 50 %
Platelets: 184 10*3/uL (ref 150–400)
RBC: 4.87 MIL/uL (ref 4.22–5.81)
RDW: 14.1 % (ref 11.5–15.5)
WBC: 7.8 10*3/uL (ref 4.0–10.5)

## 2018-02-12 LAB — GLUCOSE, CAPILLARY
Glucose-Capillary: 135 mg/dL — ABNORMAL HIGH (ref 65–99)
Glucose-Capillary: 206 mg/dL — ABNORMAL HIGH (ref 65–99)

## 2018-02-12 LAB — BASIC METABOLIC PANEL
Anion gap: 7 (ref 5–15)
BUN: 13 mg/dL (ref 6–20)
CHLORIDE: 107 mmol/L (ref 101–111)
CO2: 23 mmol/L (ref 22–32)
CREATININE: 0.8 mg/dL (ref 0.61–1.24)
Calcium: 8.9 mg/dL (ref 8.9–10.3)
GFR calc Af Amer: >60 mL/min (ref >60)
GFR calc non Af Amer: >60 mL/min (ref >60)
GLUCOSE: 161 mg/dL — AB (ref 65–99)
Potassium: 4.1 mmol/L (ref 3.5–5.1)
Sodium: 137 mmol/L (ref 135–145)

## 2018-02-12 LAB — COOXEMETRY PANEL
CARBOXYHEMOGLOBIN: 1 % (ref 0.5–1.5)
Methemoglobin: 1.3 % (ref 0.0–1.5)
O2 SAT: 59.3 %
TOTAL HEMOGLOBIN: 13.9 g/dL (ref 12.0–16.0)

## 2018-02-12 MED ORDER — SPIRONOLACTONE 25 MG PO TABS: 25.0000 mg | ORAL_TABLET | Freq: Every day | ORAL | 6 refills | Status: DC

## 2018-02-12 MED ORDER — SACUBITRIL-VALSARTAN 97-103 MG PO TABS: 1.0000 | ORAL_TABLET | Freq: Two times a day (BID) | ORAL | 3 refills | Status: DC

## 2018-02-12 MED ORDER — DIGOXIN 125 MCG PO TABS: 0.1250 mg | ORAL_TABLET | Freq: Every day | ORAL | 6 refills | Status: DC

## 2018-02-12 MED ORDER — ASPIRIN 81 MG PO CHEW: 81.0000 mg | CHEWABLE_TABLET | Freq: Every day | ORAL | 6 refills | Status: AC

## 2018-02-12 MED ORDER — CARVEDILOL 3.125 MG PO TABS: 3.1250 mg | ORAL_TABLET | Freq: Two times a day (BID) | ORAL | 6 refills | Status: DC

## 2018-02-12 MED ORDER — ATORVASTATIN CALCIUM 80 MG PO TABS: 80.0000 mg | ORAL_TABLET | Freq: Every day | ORAL | 6 refills | Status: DC

## 2018-02-12 NOTE — Progress Notes (Addendum)
Patient ID: Curtis [name], male   DOB: 1976/04/13, 42 y.o.   MRN: [id number]     Advanced Heart Failure Rounding Note  PCP-Cardiologist: No primary care provider on file.   Subjective:    Started on milrinone 0.25 mcg/kg/min 02/05/18 for CI 1.2 on RHC, milrinone increased due to persistently low output.   Coox 59.3% this am off of milrinone. CVP not connected with poor waveforms overnight. Weight down another 2 lbs.   He has been walking halls almost continuously. Denies DOE, lightheadedness, or CP.   Studies:  cMRI 02/08/18 Severely dilated LV with  LVEF 21%, Mildly dilated RV with mildly decreased systolic function. Patchy mid-wall LGE: possible viral myocarditis.  Echo: EF 15-20% with moderate LV dilation, mildly dilated RV with moderately decreased RV systolic function. The patient also has a bicuspid aortic valve with moderate AI.  The ascending aorta is dilated to 3.9 cm.   LHC/RHC (02/05/18): Extensive moderate but not critical CAD.  I would not expect this to have caused his EF to fall so markedly. On RHC, filling pressures were elevated with markedly low cardiac output and elevated SVR.  PAPi was low at 1.4 and CVP/PCWP high at 0.62 suggesting significant co-existing RV dysfunction.  RA 19 PA 60/40 (47) PAWP 35 PA sat 42% Ao sat 99% Fick CO 2.9 Fick CI 1.2  Objective:   Weight Range: 219 lb 2.2 oz (99.4 kg) Body mass index is 26.67 kg/m.   Vital Signs:   Temp:  [98.2 F (36.8 C)-98.8 F (37.1 C)] 98.2 F (36.8 C) (03/29 0428) Pulse Rate:  [75-98] 83 (03/29 0600) Resp:  [15-36] 20 (03/29 0600) BP: (105-131)/(61-90) 122/80 (03/29 0600) SpO2:  [91 %-100 %] 96 % (03/29 0600) Weight:  [219 lb 2.2 oz (99.4 kg)] 219 lb 2.2 oz (99.4 kg) (03/29 0500) Last BM Date: 02/11/18(per pt report)  Weight change: Filed Weights   02/10/18 0500 02/11/18 0500 02/12/18 0500  Weight: 223 lb 5.2 oz (101.3 kg) 221 lb 5.5 oz (100.4 kg) 219 lb 2.2 oz (99.4 kg)     Intake/Output:   Intake/Output Summary (Last 24 hours) at 02/12/2018 0711 Last data filed at 02/12/2018 0600 Gross per 24 hour  Intake 520.5 ml  Output 2750 ml  Net -2229.5 ml     Physical Exam   General: Well appearing. No resp difficulty. HEENT: Normal Neck: Supple. JVP 5-6. Carotids 2+ bilat; no bruits. No thyromegaly or nodule noted. Cor: PMI nondisplaced. RRR, 1/6 SEM RUSB Lungs: CTAB, normal effort. Abdomen: Soft, non-tender, non-distended, no HSM. No bruits or masses. +BS  Extremities: No cyanosis, clubbing, or rash. R and LLE no edema.  Neuro: Alert & orientedx3, cranial nerves grossly intact. moves all 4 extremities w/o difficulty. Affect pleasant   Telemetry   NSR 80s, personally reviewed.   EKG    No new tracings.    Labs    CBC Recent Labs    02/11/18 0352 02/12/18 0356  WBC 8.3 7.8  NEUTROABS 4.6 3.9  HGB 13.7 13.6  HCT 42.8 42.5  MCV 86.8 87.3  PLT 185 184   Basic Metabolic Panel Recent Labs    40/98/11 0352 02/12/18 0356  NA 137 137  K 4.0 4.1  CL 105 107  CO2 25 23  GLUCOSE 171* 161*  BUN 12 13  CREATININE 0.78 0.80  CALCIUM 8.6* 8.9   Liver Function Tests No results for input(s): AST, ALT, ALKPHOS, BILITOT, PROT, ALBUMIN in the last 72 hours. No results for input(s): LIPASE, AMYLASE  in the last 72 hours. Cardiac Enzymes No results for input(s): CKTOTAL, CKMB, CKMBINDEX, TROPONINI in the last 72 hours.  BNP: BNP (last 3 results) Recent Labs    02/02/18 1329  BNP 1,724.6*    ProBNP (last 3 results) No results for input(s): PROBNP in the last 8760 hours.   D-Dimer No results for input(s): DDIMER in the last 72 hours. Hemoglobin A1C No results for input(s): HGBA1C in the last 72 hours. Fasting Lipid Panel No results for input(s): CHOL, HDL, LDLCALC, TRIG, CHOLHDL, LDLDIRECT in the last 72 hours. Thyroid Function Tests No results for input(s): TSH, T4TOTAL, T3FREE, THYROIDAB in the last 72 hours.  Invalid input(s):  FREET3  Other results:   Imaging    No results found.   Medications:     Scheduled Medications: . aspirin  81 mg Oral Daily  . atorvastatin  80 mg Oral q1800  . carvedilol  3.125 mg Oral BID WC  . digoxin  0.125 mg Oral Daily  . enoxaparin (LOVENOX) injection  50 mg Subcutaneous Q24H  . fluticasone  1 spray Each Nare Daily  . insulin aspart  0-20 Units Subcutaneous TID WC  . insulin aspart  0-5 Units Subcutaneous QHS  . mometasone-formoterol  2 puff Inhalation BID  . omega-3 acid ethyl esters  1 g Oral BID  . sacubitril-valsartan  1 tablet Oral BID  . sertraline  100 mg Oral Daily  . sodium chloride flush  10-40 mL Intracatheter Q12H  . sodium chloride flush  3 mL Intravenous Q12H  . spironolactone  25 mg Oral Daily    Infusions: . sodium chloride      PRN Medications: sodium chloride, acetaminophen, albuterol, ALPRAZolam, LORazepam, ondansetron (ZOFRAN) IV, sodium chloride flush, sodium chloride flush, traZODone    Patient Profile   Curtis [name] is a 42 y.o. male with a history of HTN, asthma, HL, and DM.   Admitted for evaluation and treatment of fluid overload.   Assessment/Plan   1. Acute on chronic systolic CHF -> cardiogenic shock: Echo 02/03/18 with EF 15-20%, moderate LV dilation, mildly dilated RV with moderately decreased RV systolic function.  He had coronary angiography this admission with moderate coronary disease, but it does not explain low EF.  Possible viral myocarditis based on cardiac MRI but difficult study.  He had low output on RHC also with evidence for significant RV failure.  - Coox 59% OFF milrinone. CVP not connected, but weight has continued to trend down. He is down 40 lbs total.  - Volume status stable on exam - Will give lasix to use AS NEEDED only. Will discuss dosing with MD.  - Continue digoxin 0.125 mg daily. Level stable (0.3) after 5 doses. - Continue Entresto 97/103 mg BID.  - Continue spironolactone 25 mg daily.  -  Continue Coreg 3.125 mg BID. Will not aggressively up titrate with recent low output and marginal coox.  - Blood type is A+.   - We have discussed possible need for advanced therapies including transfer to [facility] for transplant eval. He is fortunately responding well to medical therapy.  Will continue to titrate meds. 2. CAD: Has strong family history of CAD.  On cath 3/21 he had extensive moderate CAD, but it does not explain his cardiomyopathy.  Most significant appears to be an 80% stenosis in the mid LCx.  Medical management for the time being.  - No s/s of ischemia.     - Continue ASA 81 and statin.  3. Bicuspid aortic valve  disorder:  He has moderate aortic insufficiency with a bicuspid aortic valve.  3.9 cm ascending aorta. No change.  4. DM: Continue SSI. A1C 7.8. No change. - Plan to d/c on metformin and Jardiance (No amaryl) 5. HL: LDL 44 on 3/20 - Continue statin. No change 6. OSA:  - Needs outpatient sleep study.  7. Frequent PVCs and NSVT:  - Now much more quiescent off milrinone. Amio stopped. - Continue low dose coreg.  8. SVT: Noted on milrinone.  - No further off milrinone. Amio stopped.   Likely home today. Will finalize with MD and schedule close follow up in HF clinic.  Length of Stay: [address]  Luane [employer] 02/12/2018, 7:11 AM  Advanced Heart Failure Team Pager [phone] (M-F; 7a - 4p) Please contact [facility] for night-coverage after hours (4p -7a ) and weekends on [email or web address]  Patient seen with PA, agree with the above note.  He is doing well today. Weight continues to trend down without Lasix, now down 43 lbs.  He is not volume overloaded on exam and co-ox 59% off milrinone.    I think that he can go home today.    Meds for home: Lasix 20 mg daily x 3 days prn 2 lb wt gain 1 day or 3-4 lbs in 1 week, Coreg 3.125 mg bid, Jardiance to replace Amaryl, metformin, ASA 81, atorvastatin 80 daily, Entresto 97/103 bid, spironolactone 25 daily, digoxin 0.125  daily, sertraline.   Followup 10 days CHF clinic. Will need echo in 3 months for ICD.   Marca Ancona 02/12/2018 7:41 AM

## 2018-02-12 NOTE — Care Management Note (Addendum)
Case Management Note Donn Pierini RN, BSN Unit 4E-Case Manager-- 2H coverage [phone]  Patient Details  Name: Curtis [name] MRN: [id number] Date of Birth: 08-15-1976  Subjective/Objective:  Pt admitted with CHF                  Action/Plan: PTA pt lived at home, referral for [facility] received- attempted to complete insurance check- however unable to verify pt's drug coverage- spoke with pt at bedside- per pt he has been paying out of pocket for most of his medications- has been out of town and using a [facility] - out of town- plans to stay in town here now- and will use [facility] on [address]- has a PCP- Selena Batten at [facility] on [address]- pt reports that he has some sort of drug coverage but does not have his insurance cards with him- will have his parents bring his insurance cards to the hospital to that CM can attempt to verify drug coverage on Monday AM- CM will f/u with pt over weekend once his parents bring in insurance cards to the bedside-   Expected Discharge Date:  02/12/18               Expected Discharge Plan:  Home/Self Care  In-House Referral:     Discharge planning Services  CM Consult, Medication Assistance, HF Clinic  Post Acute Care Choice:  NA Choice offered to:  NA  DME Arranged:    DME Agency:     HH Arranged:    HH Agency:     Status of Service:  Completed, signed off  If discussed at [employer], dates discussed:    Additional Comments:  02/12/18- 1330- Donn Pierini RN, CM- referral received for Jardiance- spoke with [facility] with HF who will bring pt a coupon to use on discharge- reviewed other meds for discharge- all are low cost and pt does have prescription discount card- Daphne with f/u with pt in HF clinic for further medication assistance with [facility] and Tivis Ringer. No further CM needs noted.   02/08/18- 1340- Micca Matura RN, CM- pt's parent brought in prescription coverage info- pt only has prescription discount  card- NO drug coverage insurance- Pt will try to use the discount card for all mediations but will not know what discount he has until he goes to pharmacy- CM will provide a 30 day free card for [facility]- pt will need to call toll free # for entrestro to see about further assistance available to him- have also spoken with Daphne with HF clinic who will f/u with patient regarding medications and f/u with clinic. CM will continue to follow for transition of care needs.   Darrold Span, RN 02/12/2018, 1:34 PM

## 2018-02-12 NOTE — Progress Notes (Signed)
Inaccurate readings/waveforms with CVP unable to obtain proper numbers. Has not complained of any CP/ SOB.  Still have adequate urine output.  Placed on oxygen while due to pt dropping to low 90's while sleeping.

## 2018-02-12 NOTE — Discharge Summary (Signed)
Advanced Heart Failure Discharge Note  Discharge Summary   Patient ID: Curtis [name] MRN: [id number], DOB/AGE: 1975-12-07 42 y.o. Admit date: 02/02/2018 D/C date:     02/12/2018   Primary Discharge Diagnoses:  1. Acute systolic CHF -> cardiogenic shock 2. CAD 3. Bicuspid aortic valve disorder 4. DM 5. HL 6. OSA 7. Frequent PVCs and NSVT 8. SVT  Hospital Course:   Curtis [name] is a 42 y.o. male with PMH of HTN, asthma, HL, and DM.  Admitted for evaluation and treatment of fluid overload and diagnosed with systolic CHF and cardiogenic shock.    Echo 01/2018 with EF 15-20%. LHC with low output and CAD out of proportion to his CHF. Problem based hospital course as below.   1. Acute systolic CHF -> cardiogenic shock:Echo 02/03/18 with EF 15-20%, moderate LV dilation, mildly dilated RV with moderately decreased RV systolic function. He had coronary angiography this admission with moderate coronary disease, but it does not explain low EF. Possible viral myocarditis based on cardiac MRI but difficult study. He had low output on RHC also with evidence for significant RV failure.  - Started on milrinone and gradually weaned once fluid optimized. He lost 40 lbs total and tolerated wean without difficulty.   HF medications titrated as tolerated.  - Blood type is A+.   - We have discussed possible need for advanced therapies including transfer to [facility] for transplant eval. He has thus far fortunately respondedwell to medical therapy.  2. CAD: Has strong family history of CAD. On cath 3/21 he had extensive moderate CAD, but it does not explain his cardiomyopathy. Most significant appears to be an 80% stenosis in the mid LCx. Medical management for the time being.  - No s/s of ischemia this admission. He is on ASA daily and now high intensity statin.  3. Bicuspid aortic valve disorder:   - He has moderate aortic insufficiency with a bicuspid aortic valve. 3.9 cm ascending aorta. On echo.    4. DM: Continue SSI. A1C 7.8.  - Plan to d/c on metformin and amaryl for now. He has no insurance for jardiance at this time.  5. HL: - LDL 44on 3/20. Now on High intensity statin.  6. OSA:  - Will order sleep study as outpatient.  7. Frequent PVCs and NSVT:  - Noted on milrinone. Treated transiently with amiodarone. Quiescent off milrinone.  - Used low dose coreg as well.  8. SVT:  - Noted on milrinone. Treated transiently with amiodarone. Quiescent off milrinone.   Pt examined am of 02/12/18 off milrinone and determined stable for discharge with close follow up as below. We will continue to titrate medications in the outpatient setting. Low threshold to send to [facility] for transplant evaluation with decompensation.   Discharge Weight Range: 219 lbs Discharge Vitals: Blood pressure 119/82, pulse 78, temperature 97.6 F (36.4 C), temperature source Oral, resp. rate (!) 23, height 6\' 4"  (1.93 m), weight 219 lb 2.2 oz (99.4 kg), SpO2 99 %.  Labs: Lab Results  Component Value Date   WBC 7.8 02/12/2018   HGB 13.6 02/12/2018   HCT 42.5 02/12/2018   MCV 87.3 02/12/2018   PLT 184 02/12/2018    Recent Labs  Lab 02/12/18 0356  NA 137  K 4.1  CL 107  CO2 23  BUN 13  CREATININE 0.80  CALCIUM 8.9  GLUCOSE 161*   Lab Results  Component Value Date   CHOL 95 02/03/2018   HDL 33 (L) 02/03/2018   LDLCALC  44 02/03/2018   TRIG 89 02/03/2018   BNP (last 3 results) Recent Labs    02/02/18 1329  BNP 1,724.6*   ProBNP (last 3 results) No results for input(s): PROBNP in the last 8760 hours.  Diagnostic Studies/Procedures   cMRI 02/08/18 1.  Severely dilated LV with EF 21%, diffuse hypokinesis.  2.  Mildly dilated RV with mildly decreased systolic function.  3. Difficult delayed enhancement images. However, the septum was visualized and showed patchy mid-wall LGE. This is suggestive of possible viral myocarditis.  [facility] 02/03/18 1. Significant but not critical 3-vessel  coronary artery disease, as detailed below. Severity of cardiomyopathy is out of proportion to extent of CAD, suggesting non-ischemic cardiomyopathy. 2. Severely elevated left heart, right heart, and pulmonary artery pressures. 3. Severely reduced Fick cardiac output/index.  Recommendations: 1. Advanced heart failure consultation to assist with management of decompensated systolic heart failure. Will escalate diuresis in the meantime and continue current evidence-based heart failure therapies. 2. Medical management of coronary artery disease and aggressive secondary prevention.  RHC hemodynamics RA (mean): 19 mmHg with "M/W" waveform suggesting restrictive/constrictive physiology RV (S/EDP): 60/22 mmHg PA (S/D, mean): 60/40 (47) mmHg PCWP (mean): 35 mmHg  Ao sat: 99% PA sat: 42%  Fick CO: 2.9 L/min Fick CI: 1.2 L/min/m^2  Discharge Medications   Allergies as of 02/12/2018   No Known Allergies     Medication List    STOP taking these medications   hydrochlorothiazide 25 MG tablet Commonly known as:  HYDRODIURIL   losartan 100 MG tablet Commonly known as:  COZAAR   simvastatin 20 MG tablet Commonly known as:  ZOCOR     TAKE these medications   albuterol 108 (90 Base) MCG/ACT inhaler Commonly known as:  PROVENTIL HFA;VENTOLIN HFA Inhale 2 puffs into the lungs every 6 (six) hours as needed for wheezing or shortness of breath.   aspirin 81 MG chewable tablet Chew 1 tablet (81 mg total) by mouth daily. Start taking on:  02/13/2018   atorvastatin 80 MG tablet Commonly known as:  LIPITOR Take 1 tablet (80 mg total) by mouth daily at 6 PM.   budesonide-formoterol 160-4.5 MCG/ACT inhaler Commonly known as:  SYMBICORT Inhale 2 puffs into the lungs 2 (two) times daily.   carvedilol 3.125 MG tablet Commonly known as:  COREG Take 1 tablet (3.125 mg total) by mouth 2 (two) times daily with a meal.   digoxin 0.125 MG tablet Commonly known as:  LANOXIN Take 1 tablet (0.125  mg total) by mouth daily. Start taking on:  02/13/2018   fluticasone 50 MCG/ACT nasal spray Commonly known as:  FLONASE Place 1 spray into both nostrils daily.   glimepiride 2 MG tablet Commonly known as:  AMARYL Take 2 mg by mouth daily with breakfast.   METFORMIN HCL ER PO Take 1,000 mg by mouth 2 (two) times daily.   multivitamin tablet Take 1 tablet by mouth daily.   omega-3 acid ethyl esters 1 g capsule Commonly known as:  LOVAZA Take 1 g by mouth 2 (two) times daily.   sacubitril-valsartan 97-103 MG Commonly known as:  ENTRESTO Take 1 tablet by mouth 2 (two) times daily.   sertraline 100 MG tablet Commonly known as:  ZOLOFT Take 100 mg by mouth daily.   spironolactone 25 MG tablet Commonly known as:  ALDACTONE Take 1 tablet (25 mg total) by mouth daily. Start taking on:  02/13/2018   Vitamin D3 5000 units Caps Take 5,000 Units by mouth daily.  Disposition   The patient will be discharged in stable condition to home. Discharge Instructions    (HEART FAILURE PATIENTS) Call MD:  Anytime you have any of the following symptoms: 1) 3 pound weight gain in 24 hours or 5 pounds in 1 week 2) shortness of breath, with or without a dry hacking cough 3) swelling in the hands, feet or stomach 4) if you have to sleep on extra pillows at night in order to breathe.   Complete by:  As directed    Amb Referral to Cardiac Rehabilitation   Complete by:  As directed    Diagnosis:  Heart Failure (see criteria below if ordering Phase II)   Heart Failure Type:  Chronic Systolic & Diastolic   Diet - low sodium heart healthy   Complete by:  As directed    Increase activity slowly   Complete by:  As directed    STOP any activity that causes chest pain, shortness of breath, dizziness, sweating, or exessive weakness   Complete by:  As directed      Follow-up Information    [facility]. Go on 02/22/2018.   Specialty:  Cardiology Why:   8:30 AM, Advanced Heart Failure Clinic, parking code 1100. Enter thru [employer] on [address]. Underground parking on your right. Can also use lower ED lot and enter blue awning.  Contact information: [address] [id number] mc [place] [address] [phone]            Duration of Discharge Encounter: Greater than 35 minutes   Signed, Luane School 02/12/2018, 1:08 PM

## 2018-02-12 NOTE — Progress Notes (Signed)
Discussed D/C instructions with pt and both parents.  Pt able to perform teach back regarding medications and HF care.  Pertinent questions asked by all 3, and answered to their satisfaction and understanding.  Pt d/c via W/C to car.    Lonia Blood, RN

## 2018-02-12 NOTE — Plan of Care (Signed)
HF education reinforced with family in the room.  D/C summary including medication teaching completed.  Teach back used.  Lonia Blood, RN

## 2018-02-17 ENCOUNTER — Telehealth (HOSPITAL_COMMUNITY): Payer: Self-pay

## 2018-02-17 NOTE — Telephone Encounter (Signed)
Patient has follow up appt on 02/22/2018 with Dr.Mclean. Once follow up appt has been completed, we will contact patient.

## 2018-02-18 ENCOUNTER — Telehealth (HOSPITAL_COMMUNITY): Payer: Self-pay

## 2018-02-18 NOTE — Telephone Encounter (Signed)
I called Mr [facility] to schedule an appointment. I called both numbers which I have on file and was unable to reach him. I will try again tomorrow.

## 2018-02-22 ENCOUNTER — Ambulatory Visit (HOSPITAL_COMMUNITY)
Admission: RE | Admit: 2018-02-22 | Discharge: 2018-02-22 | Disposition: A | Discharge status: Home / Self Care | Payer: No Typology Code available for payment source | Source: Ambulatory Visit | Attending: Cardiology | Admitting: Cardiology

## 2018-02-22 ENCOUNTER — Encounter (HOSPITAL_COMMUNITY): Payer: Self-pay

## 2018-02-22 VITALS — BP 106/62 | HR 86 | Wt 210.0 lb

## 2018-02-22 DIAGNOSIS — Z79899 Other long term (current) drug therapy: Secondary | ICD-10-CM | POA: Insufficient documentation

## 2018-02-22 DIAGNOSIS — Z7982 Long term (current) use of aspirin: Secondary | ICD-10-CM | POA: Insufficient documentation

## 2018-02-22 DIAGNOSIS — Z8249 Family history of ischemic heart disease and other diseases of the circulatory system: Secondary | ICD-10-CM | POA: Diagnosis not present

## 2018-02-22 DIAGNOSIS — G4733 Obstructive sleep apnea (adult) (pediatric): Secondary | ICD-10-CM | POA: Insufficient documentation

## 2018-02-22 DIAGNOSIS — I5042 Chronic combined systolic (congestive) and diastolic (congestive) heart failure: Secondary | ICD-10-CM | POA: Diagnosis not present

## 2018-02-22 DIAGNOSIS — I5022 Chronic systolic (congestive) heart failure: Secondary | ICD-10-CM | POA: Diagnosis not present

## 2018-02-22 DIAGNOSIS — Z7984 Long term (current) use of oral hypoglycemic drugs: Secondary | ICD-10-CM | POA: Diagnosis not present

## 2018-02-22 DIAGNOSIS — E119 Type 2 diabetes mellitus without complications: Secondary | ICD-10-CM | POA: Insufficient documentation

## 2018-02-22 DIAGNOSIS — I2510 Atherosclerotic heart disease of native coronary artery without angina pectoris: Secondary | ICD-10-CM | POA: Diagnosis not present

## 2018-02-22 DIAGNOSIS — E785 Hyperlipidemia, unspecified: Secondary | ICD-10-CM | POA: Diagnosis not present

## 2018-02-22 DIAGNOSIS — I110 Hypertensive heart disease with heart failure: Secondary | ICD-10-CM | POA: Diagnosis not present

## 2018-02-22 DIAGNOSIS — I493 Ventricular premature depolarization: Secondary | ICD-10-CM

## 2018-02-22 DIAGNOSIS — Q231 Congenital insufficiency of aortic valve: Secondary | ICD-10-CM | POA: Diagnosis not present

## 2018-02-22 DIAGNOSIS — Q2381 Bicuspid aortic valve: Secondary | ICD-10-CM

## 2018-02-22 LAB — BASIC METABOLIC PANEL
ANION GAP: 12 (ref 5–15)
BUN: 14 mg/dL (ref 6–20)
CALCIUM: 9.7 mg/dL (ref 8.9–10.3)
CO2: 21 mmol/L — AB (ref 22–32)
Chloride: 105 mmol/L (ref 101–111)
Creatinine, Ser: 0.71 mg/dL (ref 0.61–1.24)
GFR calc Af Amer: >60 mL/min (ref >60)
GFR calc non Af Amer: >60 mL/min (ref >60)
Glucose, Bld: 109 mg/dL — ABNORMAL HIGH (ref 65–99)
POTASSIUM: 4 mmol/L (ref 3.5–5.1)
Sodium: 138 mmol/L (ref 135–145)

## 2018-02-22 MED ORDER — CARVEDILOL 6.25 MG PO TABS: 6.2500 mg | ORAL_TABLET | Freq: Two times a day (BID) | ORAL | 11 refills | Status: DC

## 2018-02-22 NOTE — Progress Notes (Signed)
CSW met with patient and his family in the clinic. 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 Patient states he has a Multimedia [employer]" plan which has minimal basic coverage. Patient was unsure how much of recent hospitalization would be covered under this plan. Patient shared recent events leading up to recent hospitalization sharing a decline physically which included a 40 pound weight gain and overall fatigue.  CSW discussed at length the disability and medicaid process. Patient is newly diagnosed with HF and unclear yet about his disability status. CSW provided patient with information on the [facility] program to inquire about possible assistance with his current bill and upcoming services as well. Patient and family verbalize understanding and will follow up. CSW provided card for future needs and will be available if needed. Raquel Sarna, [place], [place]

## 2018-02-22 NOTE — Patient Instructions (Signed)
INCREASE Carvedilol (Coreg) to 6.25 mg twice daily. Can double up on current 3.125 mg tablets you have at home (Take 2 tabs twice daily). New Rx given for 6.25 mg tablets (Take 1 tab twice daily).  Routine lab work today. Will notify you of abnormal results, otherwise no news is good news!  EKG today.  Will refer you to the [employer] [facility]. As part of the [facility] collaboration with [facility], Rose Fillers, RN, is on site at the [employer] offering health assessments, Provider Referral Exercise Program  (PREP), wellness counseling, and [facility] Health speakers.  Contact Rose Fillers by phone at [phone] to schedule an appointment or ask a question.  Office hours: Monday, Wednesday, Friday 8am - 1pm  [email or web address]  Follow up 2 weeks with Amy Clegg NP-C.  __________________________________________________________________ [place] Code: 1100  Take all medication as prescribed the day of your appointment. Bring all medications with you to your appointment.  Do the following things EVERYDAY: 1) Weigh yourself in the morning before breakfast. Write it down and keep it in a log. 2) Take your medicines as prescribed 3) Eat low salt foods-Limit salt (sodium) to 2000 mg per day.  4) Stay as active as you can everyday 5) Limit all fluids for the day to less than 2 liters
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
## 2018-02-24 ENCOUNTER — Telehealth (HOSPITAL_COMMUNITY): Payer: Self-pay

## 2018-02-24 NOTE — Telephone Encounter (Signed)
I called Mr [facility] to schedule an appointment. He did not answer so I left a voicemail stating my information and purpose for calling and requested he call me back.

## 2018-02-25 ENCOUNTER — Telehealth (HOSPITAL_COMMUNITY): Payer: Self-pay

## 2018-02-25 NOTE — Telephone Encounter (Signed)
Patient returned phone call and stated he is interested in the Cardiac Rehab Maintenance Program. Passed referral Maintenance Coordinator.

## 2018-02-25 NOTE — Telephone Encounter (Signed)
Attempted to call patient in regards to Cardiac Rehab Maintenance - lm on vm

## 2018-02-26 ENCOUNTER — Telehealth (HOSPITAL_COMMUNITY): Payer: Self-pay | Admitting: Surgery

## 2018-02-26 ENCOUNTER — Telehealth: Payer: Self-pay

## 2018-02-26 NOTE — Telephone Encounter (Signed)
Left VM regarding referral for the PREP

## 2018-02-26 NOTE — Telephone Encounter (Signed)
Curtis [name] will be discharged at this time from the HF Community Paramedic Program.  He has been successful at home and has the ongoing support of his mother and father.  He is aware to call the HF clinic with any concerns or questions related to his HF.

## 2018-03-09 ENCOUNTER — Telehealth (HOSPITAL_COMMUNITY): Payer: Self-pay | Admitting: Pharmacist

## 2018-03-09 NOTE — Telephone Encounter (Signed)
[employer] patient assistance approved for Entresto 97-103 mg BID through 03/10/19.   Tyler Deis. Bonnye Fava, PharmD, BCPS, CPP Clinical Pharmacist Phone: [phone] 03/09/2018 3:03 PM

## 2018-03-11 ENCOUNTER — Ambulatory Visit (HOSPITAL_COMMUNITY)
Admission: RE | Admit: 2018-03-11 | Discharge: 2018-03-11 | Disposition: A | Discharge status: Home / Self Care | Payer: No Typology Code available for payment source | Source: Ambulatory Visit | Attending: Internal Medicine | Admitting: Internal Medicine

## 2018-03-11 ENCOUNTER — Encounter (HOSPITAL_COMMUNITY): Payer: Self-pay

## 2018-03-11 VITALS — BP 104/66 | HR 74 | Wt 212.6 lb

## 2018-03-11 DIAGNOSIS — Q231 Congenital insufficiency of aortic valve: Secondary | ICD-10-CM | POA: Diagnosis not present

## 2018-03-11 DIAGNOSIS — J45909 Unspecified asthma, uncomplicated: Secondary | ICD-10-CM | POA: Insufficient documentation

## 2018-03-11 DIAGNOSIS — Z8249 Family history of ischemic heart disease and other diseases of the circulatory system: Secondary | ICD-10-CM | POA: Insufficient documentation

## 2018-03-11 DIAGNOSIS — I5022 Chronic systolic (congestive) heart failure: Secondary | ICD-10-CM | POA: Diagnosis present

## 2018-03-11 DIAGNOSIS — Z7982 Long term (current) use of aspirin: Secondary | ICD-10-CM | POA: Diagnosis not present

## 2018-03-11 DIAGNOSIS — E785 Hyperlipidemia, unspecified: Secondary | ICD-10-CM

## 2018-03-11 DIAGNOSIS — E119 Type 2 diabetes mellitus without complications: Secondary | ICD-10-CM | POA: Insufficient documentation

## 2018-03-11 DIAGNOSIS — I5042 Chronic combined systolic (congestive) and diastolic (congestive) heart failure: Secondary | ICD-10-CM | POA: Diagnosis not present

## 2018-03-11 DIAGNOSIS — Z8614 Personal history of Methicillin resistant Staphylococcus aureus infection: Secondary | ICD-10-CM | POA: Diagnosis not present

## 2018-03-11 DIAGNOSIS — I429 Cardiomyopathy, unspecified: Secondary | ICD-10-CM | POA: Insufficient documentation

## 2018-03-11 DIAGNOSIS — Z79899 Other long term (current) drug therapy: Secondary | ICD-10-CM | POA: Diagnosis not present

## 2018-03-11 DIAGNOSIS — R570 Cardiogenic shock: Secondary | ICD-10-CM | POA: Insufficient documentation

## 2018-03-11 DIAGNOSIS — I2510 Atherosclerotic heart disease of native coronary artery without angina pectoris: Secondary | ICD-10-CM

## 2018-03-11 DIAGNOSIS — I110 Hypertensive heart disease with heart failure: Secondary | ICD-10-CM | POA: Diagnosis present

## 2018-03-11 DIAGNOSIS — I493 Ventricular premature depolarization: Secondary | ICD-10-CM | POA: Diagnosis not present

## 2018-03-11 DIAGNOSIS — G4733 Obstructive sleep apnea (adult) (pediatric): Secondary | ICD-10-CM | POA: Insufficient documentation

## 2018-03-11 DIAGNOSIS — Z7984 Long term (current) use of oral hypoglycemic drugs: Secondary | ICD-10-CM | POA: Insufficient documentation

## 2018-03-11 MED ORDER — CARVEDILOL 6.25 MG PO TABS: 9.3750 mg | ORAL_TABLET | Freq: Two times a day (BID) | ORAL | 3 refills | Status: DC

## 2018-03-11 NOTE — Progress Notes (Signed)
CSW met with patient and mother in the clinic. Patient reports he has not received a bill yet from the hospital. Patient states he looked into medicaid and due to assets feels he may not be eligible for medicaid coverage. CSW discussed [facility] Discount program and encouraged patient to contact financial counseling and explore options for coverage. Patient and mother verbalize understanding and will follow up. CSW available as needed. Raquel Sarna, [place], [place]

## 2018-03-11 NOTE — Progress Notes (Signed)
PCP: Dr Uvaldo Rising Primary Cardiologist: Dr Shirlee Latch   HPI: Curtis [name] a 42 y.o.malewith a history of HTN, asthma, HL, DM, and chronic systolic HF NICM? Viral.   Admitted for evaluation and treatment of fluid overload and diagnosed with systolic CHF and cardiogenic shock. Had LHC/RHC as noted below. He was on short term milrinone but able to wean off. Had SVT/PVCs on milrinone but resolved once milrinone was weaned off. Discharge weight 219 pounds.   Today returns for HF follow up. Last visit carvedilol 6.25 twice a day. Overall feeling fine. Denies SOB/PND/Orthopnea. Appetite ok. No fever or chills. Weight at home has been 207-211 pounds. He started to [employer] exercise program this week. Plans to continue for 12 weeks. Taking all medications  Cardiac Studies: cMRI (02/08/18)Severely dilated LV with  LVEF 21%, Mildly dilated RV with mildly decreased systolic function. Patchy mid-wall LGE: possible viral myocarditis.  Echo (02/12/2018) EF 15-20% with moderate LV dilation, mildly dilated RV with moderately decreased RV systolic function. The patient also has a bicuspid aortic valve with moderate AI. The ascending aorta is dilated to 3.9 cm.   LHC/RHC (02/05/18) Extensive moderate but not critical CAD. I would not expect this to have caused his EF to fall so markedly. On RHC, filling pressures were elevated with markedly low cardiac output and elevated SVR. PAPi was low at 1.4 and CVP/PCWP high at 0.62 suggesting significant co-existing RV dysfunction.  RA 19 PA 60/40 (47) PAWP 35 PA sat 42% Ao sat 99% Fick CO 2.9 Fick CI 1.2  ROS: All systems negative except as listed in HPI, PMH and Problem List.  SH:  Social History   Socioeconomic History  . Marital status: Single    Spouse name: Not on file  . Number of children: Not on file  . Years of education: Not on file  . Highest education level: Not on file  Occupational History  . Occupation: Curator  Social Needs  .  Financial resource strain: Not on file  . Food insecurity:    Worry: Not on file    Inability: Not on file  . Transportation needs:    Medical: Not on file    Non-medical: Not on file  Tobacco Use  . Smoking status: Never Smoker  . Smokeless tobacco: Never Used  Substance and Sexual Activity  . Alcohol use: No    Frequency: Never    Comment: remote occasional use  . 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 Sexual activity: Never    Partners: Female    Comment: last 2 years ago  Lifestyle  . Physical activity:    Days per week: Not on file    Minutes per session: Not on file  . Stress: Not on file  Relationships  . Social connections:    Talks on phone: Not on file    Gets together: Not on file    Attends religious service: Not on file    Active member of club or organization: Not on file    Attends meetings of clubs or organizations: Not on file    Relationship status: Not on file  . Intimate partner violence:    Fear of current or ex partner: Not on file    Emotionally abused: Not on file    Physically abused: Not on file    Forced sexual activity: Not on file  Other Topics Concern  . Not on file  Social History  Narrative  . Not on file    FH:  Family History  Problem Relation Age of Onset  . Mitral valve prolapse Mother   . Supraventricular tachycardia Mother   . CAD Father 57       CABG, stent  . Hypertension Father   . Heart failure Maternal Grandfather   . Congestive Heart Failure Paternal Uncle        has pacer, defibrillator  . CAD Paternal Grandfather   . CAD Maternal Grandmother   . CAD Maternal Uncle     Past Medical History:  Diagnosis Date  . Asthma   . CAD (coronary artery disease) 02/04/2018   LHC 02/04/18: multivessel CAD, 50% prox LAD, 80-90% mid LCx at take off of CTO'ed OM branch, 60-70% distal RCA stenosis  . Diabetes mellitus without complication (HCC)   . Hyperlipidemia   . Hypertension    . MRSA (methicillin resistant Staphylococcus aureus) 2014   nose leison MRSA positive  . Systolic heart failure (HCC) 02/03/2018   EF 15-20% on echo 02/03/18    Current Outpatient Medications  Medication Sig Dispense Refill  . albuterol (PROVENTIL HFA;VENTOLIN HFA) 108 (90 Base) MCG/ACT inhaler Inhale 2 puffs into the lungs every 6 (six) hours as needed for wheezing or shortness of breath.    Marland Kitchen aspirin 81 MG chewable tablet Chew 1 tablet (81 mg total) by mouth daily. 30 tablet 6  . atorvastatin (LIPITOR) 80 MG tablet Take 1 tablet (80 mg total) by mouth daily at 6 PM. 30 tablet 6  . budesonide-formoterol (SYMBICORT) 160-4.5 MCG/ACT inhaler Inhale 2 puffs into the lungs 2 (two) times daily.    . carvedilol (COREG) 6.25 MG tablet Take 1 tablet (6.25 mg total) by mouth 2 (two) times daily with a meal. 60 tablet 11  . Cholecalciferol (VITAMIN D3) 5000 units CAPS Take 5,000 Units by mouth daily.    . digoxin (LANOXIN) 0.125 MG tablet Take 1 tablet (0.125 mg total) by mouth daily. 30 tablet 6  . fluticasone (FLONASE) 50 MCG/ACT nasal spray Place 1 spray into both nostrils daily.    Marland Kitchen glimepiride (AMARYL) 2 MG tablet Take 2 mg by mouth daily with breakfast.    . METFORMIN HCL ER PO Take 1,000 mg by mouth 2 (two) times daily.     . Multiple Vitamin (MULTIVITAMIN) tablet Take 1 tablet by mouth daily.    Marland Kitchen omega-3 acid ethyl esters (LOVAZA) 1 g capsule Take 1 g by mouth 2 (two) times daily.    . sacubitril-valsartan (ENTRESTO) 97-103 MG Take 1 tablet by mouth 2 (two) times daily. 60 tablet 3  . sertraline (ZOLOFT) 100 MG tablet Take 100 mg by mouth daily.    Marland Kitchen spironolactone (ALDACTONE) 25 MG tablet Take 1 tablet (25 mg total) by mouth daily. 30 tablet 6   No current facility-administered medications for this encounter.     Vitals:   03/11/18 1009  BP: 104/66  Pulse: 74  SpO2: 98%  Weight: 212 lb 9.6 oz (96.4 kg)   Filed Weights   03/11/18 1009  Weight: 212 lb 9.6 oz (96.4 kg)   Wt  Readings from Last 3 Encounters:  03/11/18 212 lb 9.6 oz (96.4 kg)  02/22/18 210 lb (95.3 kg)  02/12/18 219 lb 2.2 oz (99.4 kg)    PHYSICAL EXAM: General:  Well appearing. No resp difficulty. Walked in the clinic without difficulty.  HEENT: normal Neck: supple. no JVD. Carotids 2+ bilat; no bruits. No lymphadenopathy or thryomegaly appreciated. Cor: PMI  nondisplaced. Regular rate & rhythm. No rubs, gallops or murmurs. Lungs: clear Abdomen: soft, nontender, nondistended. No hepatosplenomegaly. No bruits or masses. Good bowel sounds. Extremities: no cyanosis, clubbing, rash, edema Neuro: alert & orientedx3, cranial nerves grossly intact. moves all 4 extremities w/o difficulty. Affect pleasant  ASSESSMENT & PLAN:  1. Chronic Systolic Heart Failure  NICM/Mixed. LHC Significant but not critical 3-vessel coronary artery disease.   -ECHO 02/03/2018 EF 15-20%. RV moderately decreased RV  -CMRI EF 21%  Mildly dilated RV with mildly reduced function. Septum with patchy mild wall LGE. Possible Viral myocarditis.  - Plan to repeat ECHO the end of June. IF EF remains < 35% will need to consider ICD.QRS 122 ms on EKG.   NYHA I. Volume status stable. Does not require lasix.  - Increase carvedilol to 9.375 mg twice a day.  - Continue digoxin 0.125 mg daily. Dig level 0.3 on 02/09/2018 - On goal dose entresto 97-103 twice a day.  - Continue spironolactone 25 mg daily.   2. CAD LHC 01/2018  Significant but not critical 3-vessel coronary artery disease -No s/s ischemia - on statin, aspirin, and statin.   3. Bicuspid Aortic Valve Disorder  moderate aortic insufficiency with a bicuspid aortic valve. 3.9 cm ascending aorta.   4. DM A1C 7.8 on amaryl and metformin.  - Followed by PCP  5. HL  Continue statin.    6. OSA  - Next visit set up sleep study.   7. H/O PVCs EKG last visit with no PVCs noted.   8. H/O Asthma  9. HTN Controlled.   Continue [name] exercise program twice a week.    Follow up 4 weeks with pharmacy and 8 weeks with Dr Gala Romney and an ECHO.    Zerenity Bowron NP-C 10:10 AM

## 2018-03-11 NOTE — Patient Instructions (Signed)
INCREASE carvedilol to 9.375 (1.5 Tablets) Twice Daily  Follow up with pharmacy for medication titration in 4 weeks.  Echocardiogram and Follow up with Dr. Gala Romney in 8 weeks.

## 2018-03-11 NOTE — Addendum Note (Signed)
Encounter addended by: Marcy Siren, LCSW on: 03/11/2018 10:53 AM  Actions taken: Sign clinical note

## 2018-03-12 NOTE — Progress Notes (Signed)
[facility] PREP Progress Report   Patient Details  Name: Curtis [name] MRN: [id number] Date of Birth: 1976/09/22 Age: 42 y.o. PCP: Gweneth Dimitri, MD  Vitals:   03/08/18 1358  BP: 112/72  Pulse: 79  Resp: 18  SpO2: 98%  Weight: 213 lb (96.6 kg)  Height: 6\' 4"  (1.93 m)     [name] Eval - 03/12/18 1400      Referral    Referring Provider  HF clinic    Reason for referral  Diabetes;Heart Failure;Hypertension      Measurement   Neck measurement  15.75 Inches    Waist Circumference  38.5 inches    Body fat  21.5 percent      Information for Trainer   Goals  "get heart stronger again after chf, would like to ride bike outdoors"    Current Exercise  walks 1-95miles/day, gets tired w/ higher distances    Orthopedic Concerns  ankels, RT shoulder    Pertinent Medical History  CHF, HTN, diabetes, asthma    Medications that affect exercise  Asthma inhaler      Mobility and Daily Activities   I find it easy to walk up or down two or more flights of stairs.  4    I have no trouble taking out the trash.  4    I do housework such as vacuuming and dusting on my own without difficulty.  4    I can easily lift a gallon of milk (8lbs).  4    I can easily walk a mile.  4    I have no trouble reaching into high cupboards or reaching down to pick up something from the floor.  4    I do not have trouble doing out-door work such as Loss adjuster, chartered, raking leaves, or gardening.  1      Mobility and Daily Activities   I feel younger than my age.  2    I feel independent.  2    I feel energetic.  2    I live an active life.   2    I feel strong.  2    I feel healthy.  2    I feel active as other people my age.  2      How fit and strong are you.   Fit and Strong Total Score  39      Past Medical History:  Diagnosis Date  . Asthma   . CAD (coronary artery disease) 02/04/2018   LHC 02/04/18: multivessel CAD, 50% prox LAD, 80-90% mid LCx at take off of CTO'ed OM branch, 60-70%  distal RCA stenosis  . Diabetes mellitus without complication (HCC)   . Hyperlipidemia   . Hypertension   . MRSA (methicillin resistant Staphylococcus aureus) 2014   nose leison MRSA positive  . Systolic heart failure (HCC) 02/03/2018   EF 15-20% on echo 02/03/18   Past Surgical History:  Procedure Laterality Date  . ABSCESS DRAINAGE     MRSA inside nares  . APPENDECTOMY    . RIGHT/LEFT HEART CATH AND CORONARY ANGIOGRAPHY N/A 02/04/2018   Procedure: RIGHT/LEFT HEART CATH AND CORONARY ANGIOGRAPHY;  Surgeon: Yvonne Kendall, MD;  Location: [facility] INVASIVE CV LAB;  Service: Cardiovascular;  Laterality: N/A;   Social History   Tobacco Use  Smoking Status Never Smoker  Smokeless Tobacco Never Used    Brett Canales has registered for the PREP and started on 4/24.  Class is 2x/wk x 12 weeks.  Rose Fillers 03/12/2018, 2:04 PM

## 2018-03-21 ENCOUNTER — Telehealth: Payer: Self-pay | Admitting: Physician Assistant

## 2018-03-21 NOTE — Telephone Encounter (Signed)
Pt called stating he had a 6 lb weight gain since tues. He is not short of breath and does not have orthopnea. He does not already have lasix at home. Because he is stable, I asked him to call first thing in the morning for an appt. If he starts having shortness of breath, he will report to the ER. Patient expressed understanding of the plan. Staff message sent to Dr. Shirlee Latch and Tonye Becket (last APP who saw him).

## 2018-03-22 ENCOUNTER — Telehealth (HOSPITAL_COMMUNITY): Payer: Self-pay

## 2018-03-22 MED ORDER — FUROSEMIDE 20 MG PO TABS: 20.0000 mg | ORAL_TABLET | Freq: Every day | ORAL | 3 refills | Status: DC

## 2018-03-22 NOTE — Telephone Encounter (Signed)
Pt aware of results and agreeable to med changes (changes made in [facility])   . Laurey Morale, MD  Teresa Coombs, RN; Modesta Messing, CMA  Cc: Laurey Morale, MD        Start lasix 40 mg daily x 2 days then 20 mg daily after that. Needs appt with me or PA this week, may work in please.   Previous Messages    ----- Message -----  From: Marcelino Duster, PA  Sent: 03/21/2018  4:50 PM  To: Laurey Morale, MD, Amy Georgie Chard, NP   Pt called stating he had a 6 lb weight gain since tues. He is not short of breath and does not have orthopnea. He does not already have lasix at home. Because he is stable, I asked him to call first thing in the morning for an appt.   Thanks  Smith Robert, Eliot Ford, MD  Teresa Coombs, RN; Modesta Messing, CMA  Cc: Laurey Morale, MD        Start lasix 40 mg daily x 2 days then 20 mg daily after that. Needs appt with me or PA this week, may work in please.   Previous Messages    ----- Message -----  From: Marcelino Duster, PA  Sent: 03/21/2018  4:50 PM  To: Laurey Morale, MD, Amy Georgie Chard, NP   Pt called stating he had a 6 lb weight gain since tues. He is not short of breath and does not have orthopnea. He does not already have lasix at home. Because he is stable, I asked him to call first thing in the morning for an appt.   Thanks  Angie

## 2018-03-31 ENCOUNTER — Ambulatory Visit (HOSPITAL_COMMUNITY)
Admission: RE | Admit: 2018-03-31 | Discharge: 2018-03-31 | Disposition: A | Discharge status: Home / Self Care | Payer: No Typology Code available for payment source | Source: Ambulatory Visit | Attending: Cardiology | Admitting: Cardiology

## 2018-03-31 ENCOUNTER — Encounter (HOSPITAL_COMMUNITY): Payer: Self-pay

## 2018-03-31 VITALS — BP 122/62 | HR 77 | Wt 216.0 lb

## 2018-03-31 DIAGNOSIS — E785 Hyperlipidemia, unspecified: Secondary | ICD-10-CM | POA: Insufficient documentation

## 2018-03-31 DIAGNOSIS — Z8249 Family history of ischemic heart disease and other diseases of the circulatory system: Secondary | ICD-10-CM | POA: Diagnosis not present

## 2018-03-31 DIAGNOSIS — E119 Type 2 diabetes mellitus without complications: Secondary | ICD-10-CM | POA: Diagnosis not present

## 2018-03-31 DIAGNOSIS — R5383 Other fatigue: Secondary | ICD-10-CM | POA: Diagnosis not present

## 2018-03-31 DIAGNOSIS — I10 Essential (primary) hypertension: Secondary | ICD-10-CM

## 2018-03-31 DIAGNOSIS — I2510 Atherosclerotic heart disease of native coronary artery without angina pectoris: Secondary | ICD-10-CM | POA: Insufficient documentation

## 2018-03-31 DIAGNOSIS — Z7984 Long term (current) use of oral hypoglycemic drugs: Secondary | ICD-10-CM | POA: Diagnosis not present

## 2018-03-31 DIAGNOSIS — Q231 Congenital insufficiency of aortic valve: Secondary | ICD-10-CM | POA: Insufficient documentation

## 2018-03-31 DIAGNOSIS — Z7951 Long term (current) use of inhaled steroids: Secondary | ICD-10-CM | POA: Diagnosis not present

## 2018-03-31 DIAGNOSIS — I493 Ventricular premature depolarization: Secondary | ICD-10-CM | POA: Insufficient documentation

## 2018-03-31 DIAGNOSIS — G4733 Obstructive sleep apnea (adult) (pediatric): Secondary | ICD-10-CM | POA: Diagnosis not present

## 2018-03-31 DIAGNOSIS — I110 Hypertensive heart disease with heart failure: Secondary | ICD-10-CM | POA: Insufficient documentation

## 2018-03-31 DIAGNOSIS — Z8614 Personal history of Methicillin resistant Staphylococcus aureus infection: Secondary | ICD-10-CM | POA: Diagnosis not present

## 2018-03-31 DIAGNOSIS — Z79899 Other long term (current) drug therapy: Secondary | ICD-10-CM | POA: Diagnosis not present

## 2018-03-31 DIAGNOSIS — I5042 Chronic combined systolic (congestive) and diastolic (congestive) heart failure: Secondary | ICD-10-CM | POA: Diagnosis present

## 2018-03-31 DIAGNOSIS — J45909 Unspecified asthma, uncomplicated: Secondary | ICD-10-CM | POA: Insufficient documentation

## 2018-03-31 DIAGNOSIS — I5022 Chronic systolic (congestive) heart failure: Secondary | ICD-10-CM | POA: Insufficient documentation

## 2018-03-31 DIAGNOSIS — Z7982 Long term (current) use of aspirin: Secondary | ICD-10-CM | POA: Insufficient documentation

## 2018-03-31 DIAGNOSIS — G4730 Sleep apnea, unspecified: Secondary | ICD-10-CM | POA: Insufficient documentation

## 2018-03-31 DIAGNOSIS — I429 Cardiomyopathy, unspecified: Secondary | ICD-10-CM | POA: Insufficient documentation

## 2018-03-31 LAB — BASIC METABOLIC PANEL
Anion gap: 10 (ref 5–15)
BUN: 11 mg/dL (ref 6–20)
CHLORIDE: 104 mmol/L (ref 101–111)
CO2: 26 mmol/L (ref 22–32)
Calcium: 9.5 mg/dL (ref 8.9–10.3)
Creatinine, Ser: 0.65 mg/dL (ref 0.61–1.24)
GFR calc non Af Amer: >60 mL/min (ref >60)
GLUCOSE: 233 mg/dL — AB (ref 65–99)
Potassium: 4.1 mmol/L (ref 3.5–5.1)
Sodium: 140 mmol/L (ref 135–145)

## 2018-03-31 NOTE — Progress Notes (Signed)
PCP: Dr Uvaldo Rising Primary Cardiologist: Dr Shirlee Latch   HPI: Curtis [name] is a 42 y.o. malewith a history of HTN, asthma, HL, DM, and chronic systolic HF NICM? Viral.   Admitted for evaluation and treatment of fluid overload and diagnosed with systolic CHF and cardiogenic shock. Had LHC/RHC as noted below. He was on short term milrinone but able to wean off. Had SVT/PVCs on milrinone but resolved once milrinone was weaned off. Discharge weight 219 pounds.   Today he returns for HF follow up. Last visit coreg was increased. Overall he is doing okay. He called the clinic with 6 lb weight gain earlier in the month and was started on lasix. Weight has come down 2 lbs and he still feels bloated. Denies SOB. He is going to the [facility]. Denies orthopnea/PND/edema. Good appetite. He feels fatigued. Denies CP. He is dizzy ~2 x/week with standing up quickly. SBPs at home 100-110s. Weights at home have increased 210-216 lbs, 214 lbs this morning. He is limiting salt intake, but has been drinking a lot of water. Compliant with medications.   Cardiac Studies: cMRI (02/08/18)Severely dilated LV with  LVEF 21%, Mildly dilated RV with mildly decreased systolic function. Patchy mid-wall LGE: possible viral myocarditis.  Echo (02/12/2018) EF 15-20% with moderate LV dilation, mildly dilated RV with moderately decreased RV systolic function. The patient also has a bicuspid aortic valve with moderate AI. The ascending aorta is dilated to 3.9 cm.   LHC/RHC (02/05/18) Extensive moderate but not critical CAD. I would not expect this to have caused his EF to fall so markedly. On RHC, filling pressures were elevated with markedly low cardiac output and elevated SVR. PAPi was low at 1.4 and CVP/PCWP high at 0.62 suggesting significant co-existing RV dysfunction.  RA 19 PA 60/40 (47) PAWP 35 PA sat 42% Ao sat 99% Fick CO 2.9 Fick CI 1.2  Review of systems complete and found to be negative unless listed in HPI.    SH:  Social History   Socioeconomic History  . Marital status: Single    Spouse name: Not on file  . Number of children: Not on file  . Years of education: Not on file  . Highest education level: Not on file  Occupational History  . Occupation: Curator  Social Needs  . Financial resource strain: Not on file  . Food insecurity:    Worry: Not on file    Inability: Not on file  . Transportation needs:    Medical: Not on file    Non-medical: Not on file  Tobacco Use  . Smoking status: Never Smoker  . Smokeless tobacco: Never Used  Substance and Sexual Activity  . Alcohol use: No    Frequency: Never    Comment: remote occasional use  . 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 Sexual activity: Never    Partners: Female    Comment: last 2 years ago  Lifestyle  . Physical activity:    Days per week: Not on file    Minutes per session: Not on file  . Stress: Not on file  Relationships  . Social connections:    Talks on phone: Not on file    Gets together: Not on file    Attends religious service: Not on file    Active member of club or organization: Not on file    Attends meetings of clubs or organizations: Not on file  Relationship status: Not on file  . Intimate partner violence:    Fear of current or ex partner: Not on file    Emotionally abused: Not on file    Physically abused: Not on file    Forced sexual activity: Not on file  Other Topics Concern  . Not on file  Social History Narrative  . Not on file    FH:  Family History  Problem Relation Age of Onset  . Mitral valve prolapse Mother   . Supraventricular tachycardia Mother   . CAD Father 48       CABG, stent  . Hypertension Father   . Heart failure Maternal Grandfather   . Congestive Heart Failure Paternal Uncle        has pacer, defibrillator  . CAD Paternal Grandfather   . CAD Maternal Grandmother   . CAD Maternal Uncle     Past Medical  History:  Diagnosis Date  . Asthma   . CAD (coronary artery disease) 02/04/2018   LHC 02/04/18: multivessel CAD, 50% prox LAD, 80-90% mid LCx at take off of CTO'ed OM branch, 60-70% distal RCA stenosis  . Diabetes mellitus without complication (HCC)   . Hyperlipidemia   . Hypertension   . MRSA (methicillin resistant Staphylococcus aureus) 2014   nose leison MRSA positive  . Systolic heart failure (HCC) 02/03/2018   EF 15-20% on echo 02/03/18    Current Outpatient Medications  Medication Sig Dispense Refill  . albuterol (PROVENTIL HFA;VENTOLIN HFA) 108 (90 Base) MCG/ACT inhaler Inhale 2 puffs into the lungs every 6 (six) hours as needed for wheezing or shortness of breath.    Marland Kitchen aspirin 81 MG chewable tablet Chew 1 tablet (81 mg total) by mouth daily. 30 tablet 6  . atorvastatin (LIPITOR) 80 MG tablet Take 1 tablet (80 mg total) by mouth daily at 6 PM. 30 tablet 6  . budesonide-formoterol (SYMBICORT) 160-4.5 MCG/ACT inhaler Inhale 2 puffs into the lungs 2 (two) times daily.    . carvedilol (COREG) 6.25 MG tablet Take 1.5 tablets (9.375 mg total) by mouth 2 (two) times daily with a meal. 90 tablet 3  . Cholecalciferol (VITAMIN D3) 5000 units CAPS Take 5,000 Units by mouth daily.    . digoxin (LANOXIN) 0.125 MG tablet Take 1 tablet (0.125 mg total) by mouth daily. 30 tablet 6  . fluticasone (FLONASE) 50 MCG/ACT nasal spray Place 1 spray into both nostrils daily.    . furosemide (LASIX) 20 MG tablet Take 1 tablet (20 mg total) by mouth daily. Take 40 mg (2 tabs) daily for 2 days, then  Take 20 mg( 1 tab) daily 30 tablet 3  . glimepiride (AMARYL) 2 MG tablet Take 2 mg by mouth daily with breakfast.    . METFORMIN HCL ER PO Take 1,000 mg by mouth 2 (two) times daily.     . Multiple Vitamin (MULTIVITAMIN) tablet Take 1 tablet by mouth daily.    Marland Kitchen omega-3 acid ethyl esters (LOVAZA) 1 g capsule Take 1 g by mouth 2 (two) times daily.    . sacubitril-valsartan (ENTRESTO) 97-103 MG Take 1 tablet by  mouth 2 (two) times daily. 60 tablet 3  . sertraline (ZOLOFT) 100 MG tablet Take 100 mg by mouth daily.    Marland Kitchen spironolactone (ALDACTONE) 25 MG tablet Take 1 tablet (25 mg total) by mouth daily. 30 tablet 6   No current facility-administered medications for this encounter.     Vitals:   03/31/18 1209  BP: 122/62  Pulse:  77  SpO2: 100%  Weight: 216 lb (98 kg)   Filed Weights   03/31/18 1209  Weight: 216 lb (98 kg)   Wt Readings from Last 3 Encounters:  03/31/18 216 lb (98 kg)  03/24/18 212 lb 3.2 oz (96.3 kg)  03/11/18 212 lb 9.6 oz (96.4 kg)    PHYSICAL EXAM: General: Well appearing. No resp difficulty. HEENT: Normal Neck: Supple. JVP 8-10. Carotids 2+ bilat; no bruits. No thyromegaly or nodule noted. Cor: PMI nondisplaced. RRR, No M/G/R noted Lungs: CTAB, normal effort. Abdomen: Soft, non-tender, non-distended, no HSM. No bruits or masses. +BS  Extremities: No cyanosis, clubbing, or rash. R and LLE no edema.  Neuro: Alert & orientedx3, cranial nerves grossly intact. moves all 4 extremities w/o difficulty. Affect pleasant   ASSESSMENT & PLAN:  1. Chronic Systolic Heart Failure  NICM/Mixed. LHC Significant but not critical 3-vessel coronary artery disease.   -ECHO 02/03/2018 EF 15-20%. RV moderately decreased RV  -CMRI EF 21%  Mildly dilated RV with mildly reduced function. Septum with patchy mild wall LGE. Possible Viral myocarditis.  - Plan to repeat ECHO the end of June. IF EF remains < 35% will need to consider ICD.QRS 122 ms on EKG.   - NYHA I.  - Volume status elevated.  - Increase lasix to 40 mg daily x 2 days, then continue 20 mg daily. BMET today. - Continue carvedilol to 9.375 mg twice a day. Will not increase with fatigue.  - Continue digoxin 0.125 mg daily. Dig level 0.3 on 02/09/2018 - Continue entresto 97-103 twice a day.  - Continue spironolactone 25 mg daily. - Discussed limiting fluid and salt intake  2. CAD LHC 01/2018  Significant but not critical  3-vessel coronary artery disease - No s/s ischemia  - Continue statin, aspirin  3. Bicuspid Aortic Valve Disorder - moderate aortic insufficiency with a bicuspid aortic valve. 3.9 cm ascending aorta. No change.   4. DM - A1C 7.8 on amaryl and metformin.  - Followed by PCP. No change  5. HL  - Continue statin.    6. OSA  - Refer for sleep study  7. H/O PVCs - Recent EKG with no PVCs. No change.   8. H/O Asthma  9. HTN - Well controlled.   10. No insurance - Jackie, HF CSW discussed medicaid process with him and his mother today   Refer for sleep study BMET today Follow up 1 month with Dr Shirlee Latch with Echo Increase lasix to 40 mg daily x2 days, then 20 mg daily   Alford Highland, NP 12:16 PM

## 2018-03-31 NOTE — Progress Notes (Signed)
CSW met with patient and mother in the clinic. Patient's mother asked appropriate questions regarding medicaid and medical bills. Patient and mother state they are getting overwhelmed and stressed with the whole process. CSW provided support and discussed options and process for medicaid application. Mother and patient appeared relieved after discussion and verbalize understanding of follow up. CSW available as needed. Raquel Sarna, [place], [place]

## 2018-03-31 NOTE — Patient Instructions (Signed)
Routine lab work today. Will notify you of abnormal results, otherwise no news is good news!  Will schedule you for sleep study at [facility]. Address: [address] [place], [place], [place] [address] Phone: [phone] Their office will call you to schedule and give instructions.  Follow up 1-2 months with Dr. Shirlee Latch and echocardiogram.  ___________________________________________________________ [place] Code:  Take all medication as prescribed the day of your appointment. Bring all medications with you to your appointment.  Do the following things EVERYDAY: 1) Weigh yourself in the morning before breakfast. Write it down and keep it in a log. 2) Take your medicines as prescribed 3) Eat low salt foods-Limit salt (sodium) to 2000 mg per day.  4) Stay as active as you can everyday 5) Limit all fluids for the day to less than 2 liters

## 2018-03-31 NOTE — Progress Notes (Signed)
[facility] PREP Weekly Session   Patient Details  Name: Curtis [name] MRN: [id number] Date of Birth: 1975/11/22 Age: 42 y.o. PCP: Gweneth Dimitri, MD  Vitals:   03/24/18 1040  Weight: 212 lb 3.2 oz (96.3 kg)    [name] Weekly seesion - 03/31/18 1000      Weekly Session   Topic Discussed  Other ways to be active    Minutes exercised this week  90 minutes cardio   cardio     Fun things you did since last meeting:"cooking meals for family" Things you are grateful for:"family" Nutrition celebrations:"been tracking everything-so just making delicious healthy foods" Barriers:"medication issues-water weight gain, back on diuretic, insomnia, increased some and making me tired and dizzy.  Red tape & such w/medical bills"  Rose Fillers 03/31/2018, 10:41 AM

## 2018-04-13 ENCOUNTER — Ambulatory Visit (HOSPITAL_COMMUNITY): Payer: No Typology Code available for payment source

## 2018-04-16 NOTE — Progress Notes (Signed)
[facility] PREP Weekly Session   Patient Details  Name: Curtis [name] MRN: [id number] Date of Birth: Sep 08, 1976 Age: 42 y.o. PCP: Gweneth Dimitri, MD  Vitals:   04/16/18 2505  Weight: 219 lb (99.3 kg)    [name] Weekly seesion - 04/16/18 0900      Weekly Session   Topic Discussed  Other    Minutes exercised this week  -- "4 cardio/strength 4 or 5/flexibility 30 minutes"   "4 cardio/strength 4 or 5/flexibility 30 minutes"     Fun things you did since last meeting:"finished move-slept in my own bed" Things you are grateful for:"finished move from [place]" Barriers:"Hot outside so knocks you down a bit"  Rose Fillers 04/16/2018, 9:52 AM

## 2018-04-26 NOTE — Progress Notes (Signed)
[facility] PRE[facility] Weekly Session   Patient Details  Name: Curtis [name] MRN: [id number] Date of Birth: 02/18/1976 Age: 42 y.o. PCP: Gweneth Dimitri, MD  Vitals:   04/21/18 1220  Weight: 220 lb (99.8 kg)    [name] Weekly seesion - 04/26/18 1200      Weekly Session   Topic Discussed  Stress management and problem solving    Minutes exercised this week  -- "2hrs cardio/1 hr flexibility"   "2hrs cardio/1 hr flexibility"     Fun things you did since last meeting:"walks" Things you are grateful for:"family" Barriers:"Sleep has been wonky-tired but can't seem to fall asleep at night"   Rose Fillers 04/26/2018, 12:21 PM

## 2018-04-27 ENCOUNTER — Encounter (HOSPITAL_COMMUNITY): Payer: Self-pay | Admitting: Cardiology

## 2018-04-27 ENCOUNTER — Ambulatory Visit (HOSPITAL_BASED_OUTPATIENT_CLINIC_OR_DEPARTMENT_OTHER)
Admission: RE | Admit: 2018-04-27 | Discharge: 2018-04-27 | Disposition: A | Discharge status: Home / Self Care | Payer: No Typology Code available for payment source | Source: Ambulatory Visit | Attending: Cardiology | Admitting: Cardiology

## 2018-04-27 ENCOUNTER — Ambulatory Visit (HOSPITAL_COMMUNITY)
Admission: RE | Admit: 2018-04-27 | Discharge: 2018-04-27 | Disposition: A | Discharge status: Home / Self Care | Payer: No Typology Code available for payment source | Source: Ambulatory Visit | Attending: Family Medicine | Admitting: Family Medicine

## 2018-04-27 VITALS — BP 115/49 | HR 66 | Wt 224.2 lb

## 2018-04-27 DIAGNOSIS — I2510 Atherosclerotic heart disease of native coronary artery without angina pectoris: Secondary | ICD-10-CM | POA: Diagnosis not present

## 2018-04-27 DIAGNOSIS — I429 Cardiomyopathy, unspecified: Secondary | ICD-10-CM | POA: Insufficient documentation

## 2018-04-27 DIAGNOSIS — J45909 Unspecified asthma, uncomplicated: Secondary | ICD-10-CM | POA: Insufficient documentation

## 2018-04-27 DIAGNOSIS — G4730 Sleep apnea, unspecified: Secondary | ICD-10-CM | POA: Diagnosis not present

## 2018-04-27 DIAGNOSIS — Z79899 Other long term (current) drug therapy: Secondary | ICD-10-CM | POA: Diagnosis not present

## 2018-04-27 DIAGNOSIS — I110 Hypertensive heart disease with heart failure: Secondary | ICD-10-CM | POA: Insufficient documentation

## 2018-04-27 DIAGNOSIS — R9431 Abnormal electrocardiogram [ECG] [EKG]: Secondary | ICD-10-CM | POA: Diagnosis not present

## 2018-04-27 DIAGNOSIS — Q231 Congenital insufficiency of aortic valve: Secondary | ICD-10-CM | POA: Diagnosis not present

## 2018-04-27 DIAGNOSIS — Z8249 Family history of ischemic heart disease and other diseases of the circulatory system: Secondary | ICD-10-CM | POA: Insufficient documentation

## 2018-04-27 DIAGNOSIS — Z7982 Long term (current) use of aspirin: Secondary | ICD-10-CM | POA: Insufficient documentation

## 2018-04-27 DIAGNOSIS — E785 Hyperlipidemia, unspecified: Secondary | ICD-10-CM | POA: Diagnosis not present

## 2018-04-27 DIAGNOSIS — Z7952 Long term (current) use of systemic steroids: Secondary | ICD-10-CM | POA: Insufficient documentation

## 2018-04-27 DIAGNOSIS — I5042 Chronic combined systolic (congestive) and diastolic (congestive) heart failure: Secondary | ICD-10-CM

## 2018-04-27 DIAGNOSIS — Z09 Encounter for follow-up examination after completed treatment for conditions other than malignant neoplasm: Secondary | ICD-10-CM | POA: Diagnosis not present

## 2018-04-27 DIAGNOSIS — E119 Type 2 diabetes mellitus without complications: Secondary | ICD-10-CM | POA: Diagnosis not present

## 2018-04-27 DIAGNOSIS — Z7984 Long term (current) use of oral hypoglycemic drugs: Secondary | ICD-10-CM | POA: Diagnosis not present

## 2018-04-27 DIAGNOSIS — I5022 Chronic systolic (congestive) heart failure: Secondary | ICD-10-CM | POA: Diagnosis not present

## 2018-04-27 LAB — BASIC METABOLIC PANEL
Anion gap: 9 (ref 5–15)
BUN: 11 mg/dL (ref 6–20)
CALCIUM: 9.6 mg/dL (ref 8.9–10.3)
CO2: 25 mmol/L (ref 22–32)
CREATININE: 0.69 mg/dL (ref 0.61–1.24)
Chloride: 106 mmol/L (ref 101–111)
Glucose, Bld: 161 mg/dL — ABNORMAL HIGH (ref 65–99)
Potassium: 3.8 mmol/L (ref 3.5–5.1)
SODIUM: 140 mmol/L (ref 135–145)

## 2018-04-27 LAB — LIPID PANEL
CHOLESTEROL: 109 mg/dL (ref 0–200)
HDL: 33 mg/dL — ABNORMAL LOW (ref >40)
LDL Cholesterol: 16 mg/dL (ref 0–99)
Total CHOL/HDL Ratio: 3.3 RATIO
Triglycerides: 300 mg/dL — ABNORMAL HIGH (ref <150)
VLDL: 60 mg/dL — ABNORMAL HIGH (ref 0–40)

## 2018-04-27 MED ORDER — CARVEDILOL 12.5 MG PO TABS: 12.5000 mg | ORAL_TABLET | Freq: Two times a day (BID) | ORAL | 3 refills | Status: DC

## 2018-04-27 NOTE — Patient Instructions (Signed)
Increase Carvedilol 12.5 mg (1 tab), twice day  Labs drawn today (if we do not call you, then your lab work was stable)   Your physician recommends that you schedule a follow-up appointment in: 2 months with Dr. Shirlee Latch

## 2018-04-27 NOTE — Progress Notes (Signed)
  Echocardiogram 2D Echocardiogram has been performed.  Curtis [name] 04/27/2018, 11:52 AM

## 2018-04-28 NOTE — Progress Notes (Signed)
PCP: Dr Uvaldo Rising Primary Cardiologist: Dr Shirlee Latch   HPI: Curtis [name] is a 42 y.o. malewith a history of HTN, asthma, HL, DM, and chronic systolic HF.  He was admitted in 3/19 with dyspnea and volume overload and diagnosed with systolic CHF and cardiogenic shock.  Coronary angiography showed extensive but not critical CAD, did not explain extent of cardiomyopathy.  Cardiac MRI showed EF 21% with patchy mid-wall LGE pattern concerning for myocarditis. He was on short term milrinone but able to wean off. Had SVT/PVCs on milrinone but resolved once milrinone was weaned off. Discharge weight 219 pounds.  He was also noted to have a bicuspid aortic valve with moderate aortic insufficiency.   Echo was done today and reviewed, EF up to 35-40%.    He returns today for followup of CHF.  He is exercising at the [facility].  Generally, no exertional dyspnea (though he has an occasional day where he does not feel well).  No lightheadedness, tolerating all meds.  No chest pain.  No orthopnea/PND.  No palpitations.   ECG (personally reviewed): NSR, IVCD 118 sec, lateral TWIs  Labs (5/19): K 4.4, creatinine 0.65   PMH: 1. 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  - Echo (3/19): EF 15-20% with moderate LV dilation, mildly dilated RV with moderately decreased RV systolic function. The patient also has a bicuspid aortic valve with moderate AI. The ascending aorta is dilated to 3.9 cm.  - Cardiac MRI (3/19): Severely dilated LV with LVEF 21%, mildly dilated RV with mildly decreased systolic function. Patchy mid-wall LGE: possible viral myocarditis. - LHC/RHC (3/19): Extensive moderate but not critical CAD. I would not expect this to have caused his EF to fall so markedly. On RHC, filling pressures were elevated with markedly low cardiac output and elevated SVR. PAPi was low at 1.4 and CVP/PCWP high at 0.62 suggesting significant co-existing RV dysfunction.  - Echo (6/19): EF  35-40%, moderate diastolic dysfunction, moderate AI, bicuspid aortic valve, mildly decreased RV systolic function.  2. Bicuspid aortic valve: Echo in 6/19 showed moderate aortic insufficiency.  3. CAD: LHC (3/19) with 50% pLAD, 50% ostial ramus, 85% mid LCx, totally occluded OM2 with collaterals, 60% mRCA, 50% dRCA.  4. Type II diabetes  Review of systems complete and found to be negative unless listed in HPI.   SH:  Social History   Socioeconomic History  . Marital status: Single    Spouse name: Not on file  . Number of children: Not on file  . Years of education: Not on file  . Highest education level: Not on file  Occupational History  . Occupation: Curator  Social Needs  . Financial resource strain: Not on file  . Food insecurity:    Worry: Not on file    Inability: Not on file  . Transportation needs:    Medical: Not on file    Non-medical: Not on file  Tobacco Use  . Smoking status: Never Smoker  . Smokeless tobacco: Never Used  Substance and Sexual Activity  . Alcohol use: No    Frequency: Never    Comment: remote occasional use  . 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 Sexual activity: Never    Partners: Female    Comment: last 2 years ago  Lifestyle  . Physical activity:    Days per week: Not on file    Minutes per session: Not on file  . Stress:  Not on file  Relationships  . Social connections:    Talks on phone: Not on file    Gets together: Not on file    Attends religious service: Not on file    Active member of club or organization: Not on file    Attends meetings of clubs or organizations: Not on file    Relationship status: Not on file  . Intimate partner violence:    Fear of current or ex partner: Not on file    Emotionally abused: Not on file    Physically abused: Not on file    Forced sexual activity: Not on file  Other Topics Concern  . Not on file  Social History Narrative  . Not on file     FH:  Family History  Problem Relation Age of Onset  . Mitral valve prolapse Mother   . Supraventricular tachycardia Mother   . CAD Father 96       CABG, stent  . Hypertension Father   . Heart failure Maternal Grandfather   . Congestive Heart Failure Paternal Uncle        has pacer, defibrillator  . CAD Paternal Grandfather   . CAD Maternal Grandmother   . CAD Maternal Uncle     Current Outpatient Medications  Medication Sig Dispense Refill  . albuterol (PROVENTIL HFA;VENTOLIN HFA) 108 (90 Base) MCG/ACT inhaler Inhale 2 puffs into the lungs every 6 (six) hours as needed for wheezing or shortness of breath.    Marland Kitchen aspirin 81 MG chewable tablet Chew 1 tablet (81 mg total) by mouth daily. 30 tablet 6  . atorvastatin (LIPITOR) 80 MG tablet Take 1 tablet (80 mg total) by mouth daily at 6 PM. 30 tablet 6  . budesonide-formoterol (SYMBICORT) 160-4.5 MCG/ACT inhaler Inhale 2 puffs into the lungs 2 (two) times daily.    . carvedilol (COREG) 12.5 MG tablet Take 1 tablet (12.5 mg total) by mouth 2 (two) times daily with a meal. 60 tablet 3  . Cholecalciferol (VITAMIN D3) 5000 units CAPS Take 5,000 Units by mouth daily.    . digoxin (LANOXIN) 0.125 MG tablet Take 1 tablet (0.125 mg total) by mouth daily. 30 tablet 6  . fluticasone (FLONASE) 50 MCG/ACT nasal spray Place 1 spray into both nostrils daily.    . furosemide (LASIX) 20 MG tablet Take 40 mg by mouth.    Marland Kitchen glimepiride (AMARYL) 2 MG tablet Take 2 mg by mouth daily with breakfast.    . METFORMIN HCL ER PO Take 1,000 mg by mouth 2 (two) times daily.     . Multiple Vitamin (MULTIVITAMIN) tablet Take 1 tablet by mouth daily.    Marland Kitchen omega-3 acid ethyl esters (LOVAZA) 1 g capsule Take 1 g by mouth 2 (two) times daily.    . sacubitril-valsartan (ENTRESTO) 97-103 MG Take 1 tablet by mouth 2 (two) times daily. 60 tablet 3  . sertraline (ZOLOFT) 100 MG tablet Take 100 mg by mouth daily.    Marland Kitchen spironolactone (ALDACTONE) 25 MG tablet Take 1 tablet  (25 mg total) by mouth daily. 30 tablet 6   No current facility-administered medications for this encounter.     Vitals:   04/27/18 1146  BP: (!) 115/49  Pulse: 66  SpO2: 100%  Weight: 224 lb 4 oz (101.7 kg)   Filed Weights   04/27/18 1146  Weight: 224 lb 4 oz (101.7 kg)   Wt Readings from Last 3 Encounters:  04/27/18 224 lb 4 oz (101.7 kg)  04/21/18  220 lb (99.8 kg)  04/16/18 219 lb (99.3 kg)    PHYSICAL EXAM: General: NAD Neck: No JVD, no thyromegaly or thyroid nodule.  Lungs: Clear to auscultation bilaterally with normal respiratory effort. CV: Nondisplaced PMI.  Heart regular S1/S2, no S3/S4, no murmur.  No peripheral edema.  No carotid bruit.  Normal pedal pulses.  Abdomen: Soft, nontender, no hepatosplenomegaly, no distention.  Skin: Intact without lesions or rashes.  Neurologic: Alert and oriented x 3.  Psych: Normal affect. Extremities: No clubbing or cyanosis.  HEENT: Normal.   ASSESSMENT & PLAN:  1. Chronic systolic CHF: Primarily nonischemic cardiomyopathy, possibly viral myocarditis based on cardiac MRI.  He also has extensive but not critical CAD, cardiomyopathy is well out of proportion to CAD.  Echo was done today and reviewed, EF up to 35-40%.  NYHA class II symptoms, he is not volume overloaded.  - He appears to be out of range for ICD.  Repeat echo in 6 months to make sure EF continues to improve.  - Increase Coreg to 12.5 mg bid.  - Continue digoxin, check level today.  - Continue Entresto 97/103 bid and spironolactone 25 mg daily.  - Continue Lasix 40 mg daily, BMET today.  2. CAD: LHC 01/2018 showed significant but not critical 3-vessel coronary artery disease. No chest pain.  - Continue ASA 81 daily.  - Continue statin, check lipids today.  3. Bicuspid Aortic Valve Disorder: Echo in 6/19 with moderate AI and bicuspid aortic valve.  Ascending aorta mildly dilated 3.9 cm. Follow over time.  4. DM: Followed by PCP.  5. OSA: Suspected, still needs sleep  study.   Repeat echo in 2 months.   Marca Ancona, MD 04/28/2018

## 2018-05-03 NOTE — Progress Notes (Signed)
[facility] PREP Weekly Session   Patient Details  Name: Curtis [name] MRN: [id number] Date of Birth: 13-Nov-1976 Age: 42 y.o. PCP: Gweneth Dimitri, MD  Vitals:   04/26/18 1146  Weight: 221 lb (100.2 kg)    [name] Weekly seesion - 05/03/18 1100      Weekly Session   Topic Discussed  Importance of resistance training    Minutes exercised this week  -- "2hrs cardio"   "2hrs cardio"     Fun things since last meeting:"walks & naps" Things you are grateful for:"better echocardiogram, not totally there, but closer" Nutrition celebrations:"better tomatoes finally, yeah sammiches" Barriers:"Med adjustments"  Rose Fillers 05/03/2018, 11:47 AM

## 2018-05-06 ENCOUNTER — Other Ambulatory Visit (HOSPITAL_COMMUNITY): Payer: No Typology Code available for payment source

## 2018-05-06 ENCOUNTER — Encounter (HOSPITAL_COMMUNITY): Payer: No Typology Code available for payment source | Admitting: Internal Medicine

## 2018-05-10 NOTE — Progress Notes (Signed)
[facility] PREP Weekly Session   Patient Details  Name: Curtis [name] MRN: [id number] Date of Birth: 11-Nov-1976 Age: 42 y.o. PCP: Gweneth Dimitri, MD  Vitals:   05/05/18 1409  Weight: 220 lb (99.8 kg)    [name] sees[facility] - 05/10/18 1400      Weekly Session   Topic Discussed  Expectations and non-scale victories    Minutes exercised this week  180 minutes 120cardio/42flexibility   120cardio/70flexibility     Fun things you did since last meeting:"walks, workouts" Things you are grateful for:"Family"  Rose Fillers 05/10/2018, 2:10 PM

## 2018-05-12 NOTE — Progress Notes (Signed)
[facility] PREP Weekly Session   Patient Details  Name: Curtis [name] MRN: [id number] Date of Birth: 11/29/1975 Age: 42 y.o. PCP: Gweneth Dimitri, MD  Vitals:   05/12/18 1424  Weight: 220 lb (99.8 kg)    [name] sees[facility] - 05/12/18 1400      Weekly Session   Topic Discussed  Other    Minutes exercised this week  -- Portion control   Portion control     Fun things you did since last meeting:"cooking" Things you are grateful for:"family improving health" Nutrition celebrations:"introducing more vegetarian food to the family" Barriers:"occasional dizziness from medications"  Rose Fillers 05/12/2018, 2:24 PM

## 2018-05-16 ENCOUNTER — Other Ambulatory Visit (HOSPITAL_COMMUNITY): Payer: Self-pay | Admitting: Cardiology

## 2018-05-24 NOTE — Progress Notes (Signed)
[facility] PREP Weekly Session   Patient Details  Name: Curtis [name] MRN: [id number] Date of Birth: [birth year] Age: 42 y.o. PCP: Gweneth Dimitri, MD  Vitals:   05/19/18 1132  Weight: 222 lb (100.7 kg)    [name] Weekly sees[facility] - 05/24/18 1100      Weekly Session   Topic Discussed  Hitting roadblocks    Minutes exercised this week  150 minutes 120cardio/30strength   120cardio/30strength     Things you are grateful for:"family & health" Barriers:"sleep schedule is super wonky"  Rose Fillers 05/24/2018, 11:33 AM

## 2018-05-31 NOTE — Progress Notes (Signed)
[facility] PREP Weekly Session   Patient Details  Name: Curtis [name] MRN: [id number] Date of Birth: 09/19/1976 Age: 42 y.o. PCP: Gweneth Dimitri, MD  Vitals:   05/26/18 1531  Weight: 222 lb (100.7 kg)    [name] Weekly seesion - 05/31/18 1500      Weekly Session   Topic Discussed  Calorie breakdown    Minutes exercised this week  150 minutes 120cardio/30stength   120cardio/30stength     Things you are grateful for:"family, food" Barriers:"a bit of insomnia"   Rose Fillers 05/31/2018, 3:31 PM

## 2018-06-01 ENCOUNTER — Telehealth (HOSPITAL_COMMUNITY): Payer: Self-pay | Admitting: *Deleted

## 2018-06-06 IMAGING — MR MR CARD MORPHOLOGY WO/W CM
10 of 11 series · 38 of 40 positions shown · IV contrast (38    MH)
Comparison: none

CLINICAL DATA: Cardiomyopathy of uncertain etiology.

EXAM:
CARDIAC MRI
TECHNIQUE: The patient was scanned on a 1.5 Tesla GE magnet. A dedicated
cardiac coil was used. Functional imaging was done using Fiesta
sequences. [DATE], and 4 chamber views were done to assess for RWMA's.
Modified Guevarra rule using a short axis stack was used to
calculate an ejection fraction on a dedicated work station using
Circle software. The patient received 38 cc of Multihance. After 10
minutes inversion recovery sequences were used to assess for
infiltration and scar tissue.
CONTRAST:  38 cc Multihance

[Series 3: bSSFP · sagittal · 8.0mm · 1.45mm/px · 1 of 16 slices shown (1 of 5)]
[im 1/16]
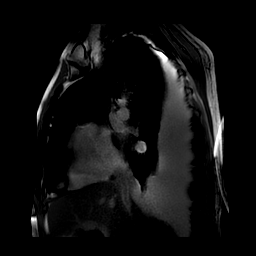

[Series 4: bSSFP · oblique · 8.0mm · 1.37mm/px · 1 of 20 slices shown (2 of 5)]
[im 1/20]
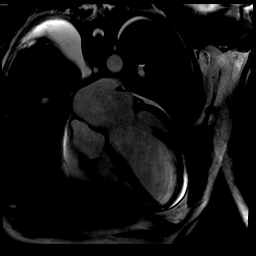

[Series 5: T1 · oblique · 8.0mm · 1.41mm/px · 1 of 16 slices shown]
[im 1/16]
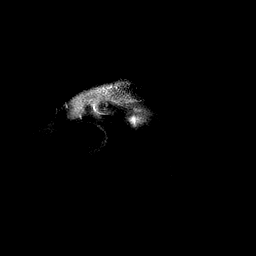

[Series 6: bSSFP · oblique · 8.0mm · 1.25mm/px · 18 of 380 slices shown (3 of 5)]
[im 1/380]
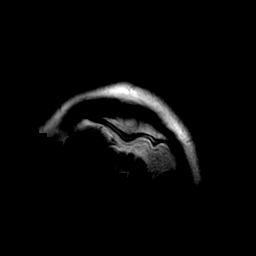
[im 23/380]
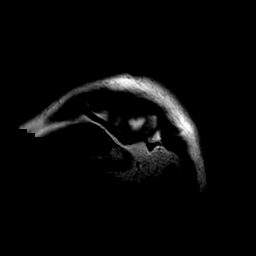
[im 45/380]
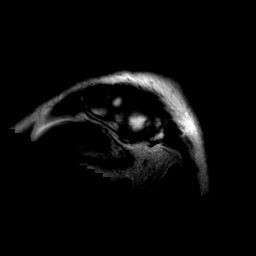
[im 67/380]
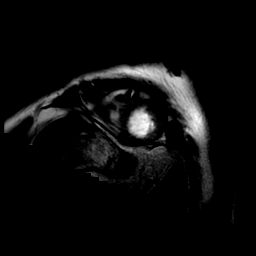
[im 90/380]
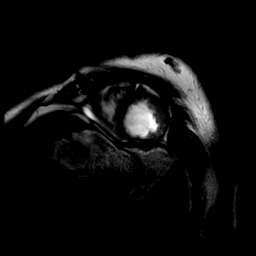
[im 112/380]
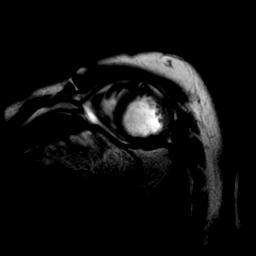
[im 134/380]
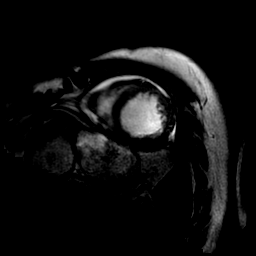
[im 157/380]
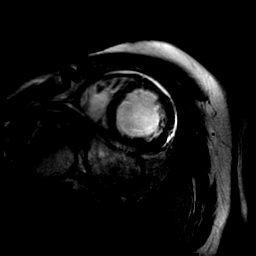
[im 179/380]
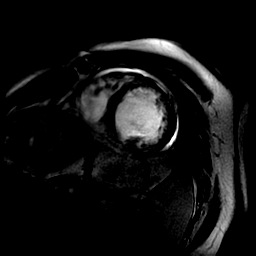
[im 201/380]
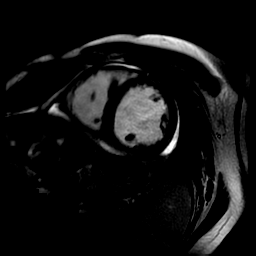
[im 223/380]
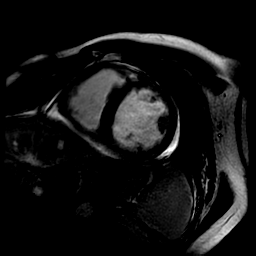
[im 246/380]
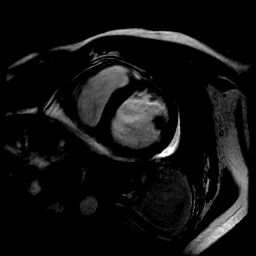
[im 268/380]
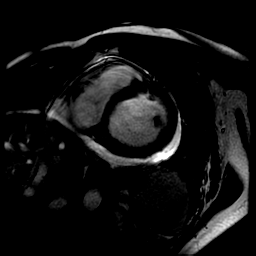
[im 290/380]
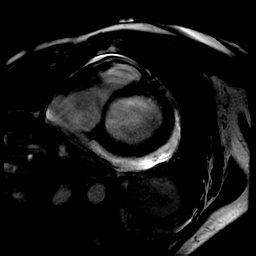
[im 313/380]
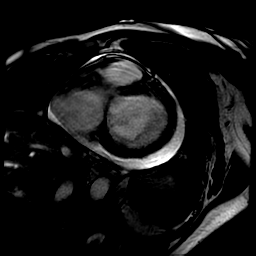
[im 335/380]
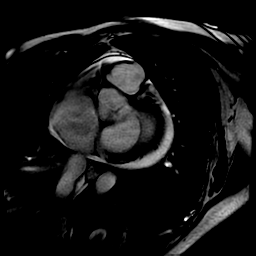
[im 357/380]
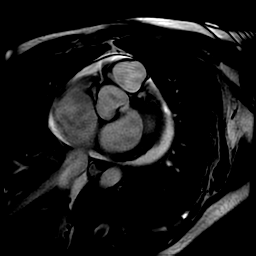
[im 380/380]
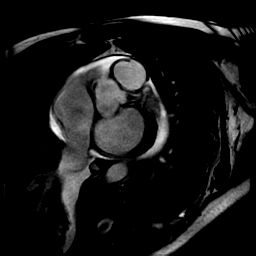

[Series 8: qpqs (id) · axial · 8.0mm · 1.48mm/px · z∈[+14,+14]mm · 2 of 60 slices shown]
[im 1/60]
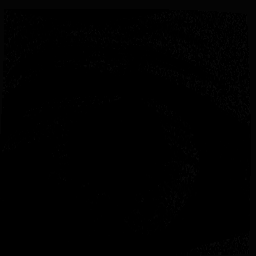
[im 60/60]
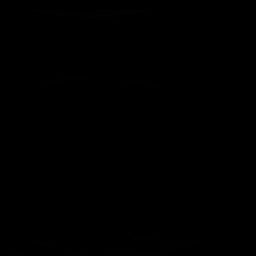

[Series 10: bSSFP · oblique · 6.0mm · 1.25mm/px · 8 of 160 slices shown (4 of 5)]
[im 1/160]
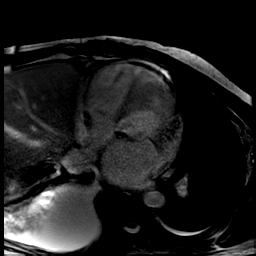
[im 23/160]
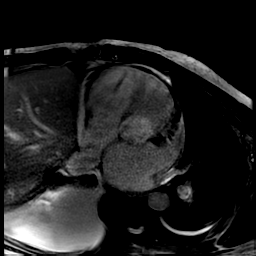
[im 46/160]
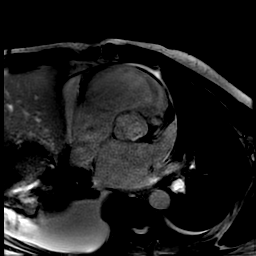
[im 69/160]
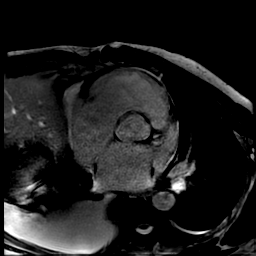
[im 91/160]
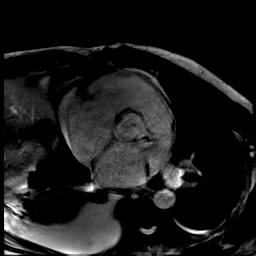
[im 114/160]
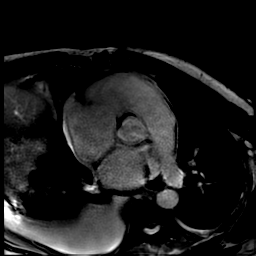
[im 137/160]
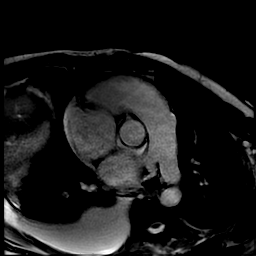
[im 160/160]
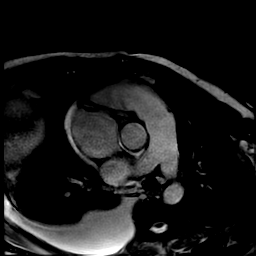

[Series 12: bSSFP · oblique · 8.0mm · 1.48mm/px · 3 of 60 slices shown (5 of 5)]
[im 1/60]
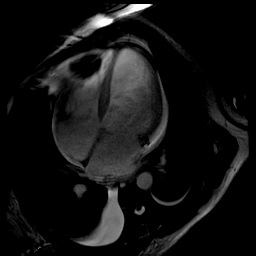
[im 30/60]
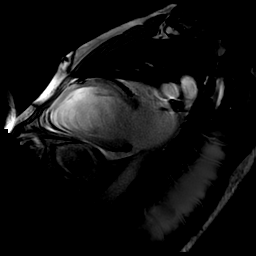
[im 60/60]
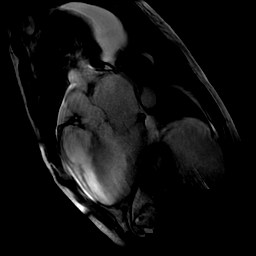

[Series 15: cine ir · oblique · 8.0mm · 1.25mm/px · 2 of 30 slices shown]
[im 1/30]
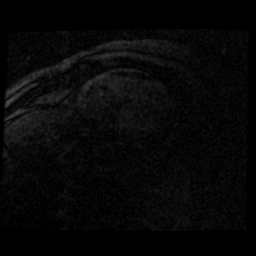
[im 30/30]
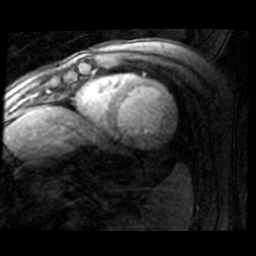

[Series 22: delayed ir prep · oblique · 8.0mm · 1.37mm/px · 1 of 15 slices shown]
[im 1/15]
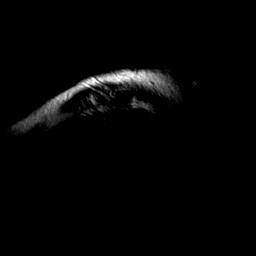

[Series 24: rad mde · oblique · 8.0mm · 1.56mm/px · 1 of 4 slices shown]
[im 1/4]
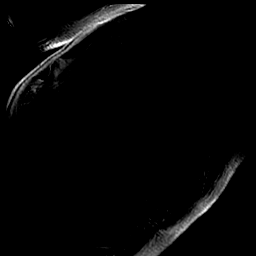

[38 of 40 positions shown; findings below may reference images not displayed]

FINDINGS: Moderate right pleural effusion.  Small pericardial effusion.

The left ventricle was severely dilated with severe diffuse
hypokinesis, EF 21%. Mildly dilated right ventricle with mildly
decreased systolic function. Moderate to severely dilated left
atrium. Moderately dilated right atrium. Bicuspid aortic valve
without significant stenosis. There was aortic insufficiency that
was not fully visualized. However, regurgitant fraction 8% by flow
imaging suggests only mild aortic insufficiency. No significant
mitral regurgitation was noted.

Delayed enhancement images are very difficult to interpret due to
significant respiratory artifact. However, there does appear to be
patchy mid-wall late gadolinium enhancement (LGE) in the septum. The
other walls were not well-visualized.

Measurements:
LVEDV 408 mL

LVSV 85 mL

LVEF 21%
IMPRESSION: 1.  Severely dilated LV with EF 21%, diffuse hypokinesis.

2.  Mildly dilated RV with mildly decreased systolic function.

3. Difficult delayed enhancement images. However, the septum was
visualized and showed patchy mid-wall LGE. This is suggestive of
possible viral myocarditis.

Klpigbb Moolman

## 2018-07-02 ENCOUNTER — Ambulatory Visit (HOSPITAL_COMMUNITY)
Admission: RE | Admit: 2018-07-02 | Discharge: 2018-07-02 | Disposition: A | Discharge status: Home / Self Care | Payer: No Typology Code available for payment source | Source: Ambulatory Visit | Attending: Cardiology | Admitting: Cardiology

## 2018-07-02 VITALS — BP 99/45 | HR 69 | Wt 222.0 lb

## 2018-07-02 DIAGNOSIS — E119 Type 2 diabetes mellitus without complications: Secondary | ICD-10-CM | POA: Insufficient documentation

## 2018-07-02 DIAGNOSIS — I2510 Atherosclerotic heart disease of native coronary artery without angina pectoris: Secondary | ICD-10-CM | POA: Insufficient documentation

## 2018-07-02 DIAGNOSIS — I429 Cardiomyopathy, unspecified: Secondary | ICD-10-CM | POA: Diagnosis not present

## 2018-07-02 DIAGNOSIS — I110 Hypertensive heart disease with heart failure: Secondary | ICD-10-CM | POA: Insufficient documentation

## 2018-07-02 DIAGNOSIS — Z09 Encounter for follow-up examination after completed treatment for conditions other than malignant neoplasm: Secondary | ICD-10-CM | POA: Diagnosis present

## 2018-07-02 DIAGNOSIS — Q231 Congenital insufficiency of aortic valve: Secondary | ICD-10-CM | POA: Insufficient documentation

## 2018-07-02 DIAGNOSIS — I5022 Chronic systolic (congestive) heart failure: Secondary | ICD-10-CM | POA: Insufficient documentation

## 2018-07-02 DIAGNOSIS — Z8249 Family history of ischemic heart disease and other diseases of the circulatory system: Secondary | ICD-10-CM | POA: Insufficient documentation

## 2018-07-02 DIAGNOSIS — I5042 Chronic combined systolic (congestive) and diastolic (congestive) heart failure: Secondary | ICD-10-CM | POA: Diagnosis not present

## 2018-07-02 DIAGNOSIS — Z79899 Other long term (current) drug therapy: Secondary | ICD-10-CM | POA: Diagnosis not present

## 2018-07-02 DIAGNOSIS — Z7984 Long term (current) use of oral hypoglycemic drugs: Secondary | ICD-10-CM | POA: Diagnosis not present

## 2018-07-02 DIAGNOSIS — Z7982 Long term (current) use of aspirin: Secondary | ICD-10-CM | POA: Insufficient documentation

## 2018-07-02 LAB — LIPID PANEL
CHOL/HDL RATIO: 2.6 ratio
Cholesterol: 78 mg/dL (ref 0–200)
HDL: 30 mg/dL — ABNORMAL LOW (ref >40)
LDL CALC: 28 mg/dL (ref 0–99)
TRIGLYCERIDES: 100 mg/dL (ref <150)
VLDL: 20 mg/dL (ref 0–40)

## 2018-07-02 LAB — DIGOXIN LEVEL: Digoxin Level: 0.7 ng/mL — ABNORMAL LOW (ref 0.8–2.0)

## 2018-07-02 LAB — BASIC METABOLIC PANEL
Anion gap: 10 (ref 5–15)
BUN: 13 mg/dL (ref 6–20)
CALCIUM: 9.2 mg/dL (ref 8.9–10.3)
CHLORIDE: 107 mmol/L (ref 98–111)
CO2: 23 mmol/L (ref 22–32)
CREATININE: 0.68 mg/dL (ref 0.61–1.24)
GFR calc Af Amer: >60 mL/min (ref >60)
GFR calc non Af Amer: >60 mL/min (ref >60)
Glucose, Bld: 126 mg/dL — ABNORMAL HIGH (ref 70–99)
Potassium: 3.8 mmol/L (ref 3.5–5.1)
Sodium: 140 mmol/L (ref 135–145)

## 2018-07-02 NOTE — Patient Instructions (Signed)
Labs today (will call for abnormal results, otherwise no news is good news)  Echocardiogram and Follow up in 4 months.  

## 2018-07-02 NOTE — Progress Notes (Signed)
PCP: Dr Uvaldo Rising Primary Cardiologist: Dr Shirlee Latch   HPI: Curtis [name] is a 42 y.o. malewith a history of HTN, asthma, HL, DM, and chronic systolic HF.  He was admitted in 3/19 with dyspnea and volume overload and diagnosed with systolic CHF and cardiogenic shock.  Coronary angiography showed extensive but not critical CAD, did not explain extent of cardiomyopathy.  Cardiac MRI showed EF 21% with patchy mid-wall LGE pattern concerning for myocarditis. He was on short term milrinone but able to wean off. Had SVT/PVCs on milrinone but resolved once milrinone was weaned off. Discharge weight 219 pounds.  He was also noted to have a bicuspid aortic valve with moderate aortic insufficiency.   Echo in 6/19 showed EF up to 35-40%.    He returns today for followup of CHF.  Overall, doing well.  Taking all his meds.  No signficant exertional dyspnea.  No chest pain.  No orthopnea/PND.  He is exercising regularly.  Mild lightheadedness if he stands up too fast, no bothersome lightheadedness.    ECG (personally reviewed): NSR, inferior and lateral TWIs, QRS 116 msec  Labs (5/19): K 4.4, creatinine 0.65  Labs (6/19): K 3.8, creatinine 0.69, LDL 16, TGs 300, HDL 33  PMH: 1. 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  - Echo (3/19): EF 15-20% with moderate LV dilation, mildly dilated RV with moderately decreased RV systolic function. The patient also has a bicuspid aortic valve with moderate AI. The ascending aorta is dilated to 3.9 cm.  - Cardiac MRI (3/19): Severely dilated LV with LVEF 21%, mildly dilated RV with mildly decreased systolic function. Patchy mid-wall LGE: possible viral myocarditis. - LHC/RHC (3/19): Extensive moderate but not critical CAD. I would not expect this to have caused his EF to fall so markedly. On RHC, filling pressures were elevated with markedly low cardiac output and elevated SVR. PAPi was low at 1.4 and CVP/PCWP high at 0.62 suggesting  significant co-existing RV dysfunction.  - Echo (6/19): EF 35-40%, moderate diastolic dysfunction, moderate AI, bicuspid aortic valve, mildly decreased RV systolic function.  2. Bicuspid aortic valve: Echo in 6/19 showed moderate aortic insufficiency.  3. CAD: LHC (3/19) with 50% pLAD, 50% ostial ramus, 85% mid LCx, totally occluded OM2 with collaterals, 60% mRCA, 50% dRCA.  4. Type II diabetes  Review of systems complete and found to be negative unless listed in HPI.   SH:  Social History   Socioeconomic History  . Marital status: Single    Spouse name: Not on file  . Number of children: Not on file  . Years of education: Not on file  . Highest education level: Not on file  Occupational History  . Occupation: Curator  Social Needs  . Financial resource strain: Not on file  . Food insecurity:    Worry: Not on file    Inability: Not on file  . Transportation needs:    Medical: Not on file    Non-medical: Not on file  Tobacco Use  . Smoking status: Never Smoker  . Smokeless tobacco: Never Used  Substance and Sexual Activity  . Alcohol use: No    Frequency: Never    Comment: remote occasional use  . 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 Sexual activity: Never    Partners: Female    Comment: last 2 years ago  Lifestyle  . Physical activity:    Days per week: Not on file  Minutes per session: Not on file  . Stress: Not on file  Relationships  . Social connections:    Talks on phone: Not on file    Gets together: Not on file    Attends religious service: Not on file    Active member of club or organization: Not on file    Attends meetings of clubs or organizations: Not on file    Relationship status: Not on file  . Intimate partner violence:    Fear of current or ex partner: Not on file    Emotionally abused: Not on file    Physically abused: Not on file    Forced sexual activity: Not on file  Other Topics Concern    . Not on file  Social History Narrative  . Not on file    FH:  Family History  Problem Relation Age of Onset  . Mitral valve prolapse Mother   . Supraventricular tachycardia Mother   . CAD Father 35       CABG, stent  . Hypertension Father   . Heart failure Maternal Grandfather   . Congestive Heart Failure Paternal Uncle        has pacer, defibrillator  . CAD Paternal Grandfather   . CAD Maternal Grandmother   . CAD Maternal Uncle     Current Outpatient Medications  Medication Sig Dispense Refill  . albuterol (PROVENTIL HFA;VENTOLIN HFA) 108 (90 Base) MCG/ACT inhaler Inhale 2 puffs into the lungs every 6 (six) hours as needed for wheezing or shortness of breath.    Marland Kitchen aspirin 81 MG chewable tablet Chew 1 tablet (81 mg total) by mouth daily. 30 tablet 6  . atorvastatin (LIPITOR) 80 MG tablet Take 1 tablet (80 mg total) by mouth daily at 6 PM. 30 tablet 6  . carvedilol (COREG) 12.5 MG tablet Take 1 tablet (12.5 mg total) by mouth 2 (two) times daily with a meal. 60 tablet 3  . Cholecalciferol (VITAMIN D3) 5000 units CAPS Take 5,000 Units by mouth daily.    . digoxin (LANOXIN) 0.125 MG tablet Take 1 tablet (0.125 mg total) by mouth daily. 30 tablet 6  . fluticasone (FLONASE) 50 MCG/ACT nasal spray Place 1 spray into both nostrils daily.    . furosemide (LASIX) 20 MG tablet Take 40 mg by mouth daily.    Marland Kitchen glimepiride (AMARYL) 2 MG tablet Take 2 mg by mouth daily with breakfast.    . METFORMIN HCL ER PO Take 1,000 mg by mouth 2 (two) times daily.     . Multiple Vitamin (MULTIVITAMIN) tablet Take 1 tablet by mouth daily.    Marland Kitchen omega-3 acid ethyl esters (LOVAZA) 1 g capsule Take 1 g by mouth 2 (two) times daily.    . sacubitril-valsartan (ENTRESTO) 97-103 MG Take 1 tablet by mouth 2 (two) times daily. 60 tablet 3  . sertraline (ZOLOFT) 100 MG tablet Take 100 mg by mouth daily.    Marland Kitchen spironolactone (ALDACTONE) 25 MG tablet Take 1 tablet (25 mg total) by mouth daily. 30 tablet 6   No  current facility-administered medications for this encounter.     Vitals:   07/02/18 1149  BP: (!) 99/45  Pulse: 69  SpO2: 99%  Weight: 100.7 kg (222 lb)   Filed Weights   07/02/18 1149  Weight: 100.7 kg (222 lb)   Wt Readings from Last 3 Encounters:  07/02/18 100.7 kg (222 lb)  05/26/18 100.7 kg (222 lb)  05/19/18 100.7 kg (222 lb)    PHYSICAL  EXAM: General: NAD Neck: No JVD, no thyromegaly or thyroid nodule.  Lungs: Clear to auscultation bilaterally with normal respiratory effort. CV: Nondisplaced PMI.  Heart regular S1/S2, no S3/S4, no murmur.  No peripheral edema.  No carotid bruit.  Normal pedal pulses.  Abdomen: Soft, nontender, no hepatosplenomegaly, no distention.  Skin: Intact without lesions or rashes.  Neurologic: Alert and oriented x 3.  Psych: Normal affect. Extremities: No clubbing or cyanosis.  HEENT: Normal.    ASSESSMENT & PLAN:  1. Chronic systolic CHF: Primarily nonischemic cardiomyopathy, possibly viral myocarditis based on cardiac MRI.  He also has extensive but not critical CAD, cardiomyopathy is well out of proportion to CAD.  Echo in 6/19 showed EF up to 35-40%.  NYHA class II symptoms, he is not volume overloaded.  - He appears to be out of range for ICD.  Repeat echo in 12/19 to make sure EF continues to improve.  - Continue Coreg 12.5 mg bid, no BP room today to increase.  - Continue digoxin, check level today.  - Continue Entresto 97/103 bid and spironolactone 25 mg daily.  - Continue Lasix 40 mg daily, BMET today.  2. CAD: LHC 01/2018 showed significant but not critical 3-vessel coronary artery disease. No chest pain.  - Continue ASA 81 daily.  - Continue statin, good LDL in 6/19 but TGs high . Will repeat lipids today to reassess TGs.  3. Bicuspid Aortic Valve Disorder: Echo in 6/19 with moderate AI and bicuspid aortic valve.  Ascending aorta mildly dilated 3.9 cm. Follow over time.  4. DM: Followed by PCP.   Followup in 12/19 with echo and  office visit.    Marca Ancona, MD 07/05/2018

## 2018-07-18 ENCOUNTER — Other Ambulatory Visit (HOSPITAL_COMMUNITY): Payer: Self-pay | Admitting: Cardiology

## 2018-08-25 ENCOUNTER — Other Ambulatory Visit (HOSPITAL_COMMUNITY): Payer: Self-pay | Admitting: Cardiology

## 2018-09-13 ENCOUNTER — Other Ambulatory Visit (HOSPITAL_COMMUNITY): Payer: Self-pay | Admitting: Student

## 2018-10-12 ENCOUNTER — Other Ambulatory Visit (HOSPITAL_COMMUNITY): Payer: Self-pay | Admitting: Cardiology

## 2018-11-02 ENCOUNTER — Encounter (HOSPITAL_COMMUNITY): Payer: Self-pay | Admitting: Cardiology

## 2018-11-02 ENCOUNTER — Ambulatory Visit (HOSPITAL_BASED_OUTPATIENT_CLINIC_OR_DEPARTMENT_OTHER)
Admission: RE | Admit: 2018-11-02 | Discharge: 2018-11-02 | Disposition: A | Discharge status: Home / Self Care | Payer: No Typology Code available for payment source | Source: Ambulatory Visit | Attending: Cardiology | Admitting: Cardiology

## 2018-11-02 ENCOUNTER — Ambulatory Visit (HOSPITAL_COMMUNITY)
Admission: RE | Admit: 2018-11-02 | Discharge: 2018-11-02 | Disposition: A | Discharge status: Home / Self Care | Payer: No Typology Code available for payment source | Source: Ambulatory Visit | Attending: Cardiology | Admitting: Cardiology

## 2018-11-02 VITALS — BP 118/60 | HR 67 | Wt 218.4 lb

## 2018-11-02 DIAGNOSIS — I5042 Chronic combined systolic (congestive) and diastolic (congestive) heart failure: Secondary | ICD-10-CM

## 2018-11-02 DIAGNOSIS — Z7982 Long term (current) use of aspirin: Secondary | ICD-10-CM | POA: Diagnosis not present

## 2018-11-02 DIAGNOSIS — I110 Hypertensive heart disease with heart failure: Secondary | ICD-10-CM | POA: Insufficient documentation

## 2018-11-02 DIAGNOSIS — Q231 Congenital insufficiency of aortic valve: Secondary | ICD-10-CM

## 2018-11-02 DIAGNOSIS — I2510 Atherosclerotic heart disease of native coronary artery without angina pectoris: Secondary | ICD-10-CM

## 2018-11-02 DIAGNOSIS — I5022 Chronic systolic (congestive) heart failure: Secondary | ICD-10-CM | POA: Diagnosis not present

## 2018-11-02 DIAGNOSIS — Z8249 Family history of ischemic heart disease and other diseases of the circulatory system: Secondary | ICD-10-CM | POA: Insufficient documentation

## 2018-11-02 DIAGNOSIS — E119 Type 2 diabetes mellitus without complications: Secondary | ICD-10-CM | POA: Diagnosis not present

## 2018-11-02 DIAGNOSIS — I428 Other cardiomyopathies: Secondary | ICD-10-CM | POA: Diagnosis not present

## 2018-11-02 DIAGNOSIS — J45909 Unspecified asthma, uncomplicated: Secondary | ICD-10-CM | POA: Insufficient documentation

## 2018-11-02 LAB — BASIC METABOLIC PANEL
ANION GAP: 10 (ref 5–15)
BUN: 14 mg/dL (ref 6–20)
CALCIUM: 9.4 mg/dL (ref 8.9–10.3)
CO2: 25 mmol/L (ref 22–32)
CREATININE: 0.91 mg/dL (ref 0.61–1.24)
Chloride: 104 mmol/L (ref 98–111)
GLUCOSE: 210 mg/dL — AB (ref 70–99)
Potassium: 4.2 mmol/L (ref 3.5–5.1)
Sodium: 139 mmol/L (ref 135–145)

## 2018-11-02 LAB — DIGOXIN LEVEL: Digoxin Level: 0.6 ng/mL — ABNORMAL LOW (ref 0.8–2.0)

## 2018-11-02 NOTE — Progress Notes (Signed)
  Echocardiogram 2D Echocardiogram has been performed.  Janalyn Harder 11/02/2018, 11:49 AM

## 2018-11-02 NOTE — Patient Instructions (Signed)
Labs today We will only contact you if something comes back abnormal or we need to make some changes. Otherwise no news is good news!  Your physician recommends that you schedule a follow-up appointment in: 4 months. Please call in January to make your appointment.

## 2018-11-02 NOTE — Progress Notes (Signed)
PCP: Dr Uvaldo Rising Primary Cardiologist: Dr Shirlee Latch   HPI: Curtis [name] is a 42 y.o. malewith a history of HTN, asthma, HL, DM, and chronic systolic HF.  He was admitted in 3/19 with dyspnea and volume overload and diagnosed with systolic CHF and cardiogenic shock.  Coronary angiography showed extensive but not critical CAD, did not explain extent of cardiomyopathy.  Cardiac MRI showed EF 21% with patchy mid-wall LGE pattern concerning for myocarditis. He was on short term milrinone but able to wean off. Had SVT/PVCs on milrinone but resolved once milrinone was weaned off. Discharge weight 219 pounds.  He was also noted to have a bicuspid aortic valve with moderate aortic insufficiency.   Echo in 6/19 showed EF up to 35-40%.  Echo was done today and reviewed, EF 45% with bicuspid aortic valve and moderate AI.   He returns today for followup of CHF.  He has been stable recently.  Exercising at the [facility].  No significant exertional dyspnea or chest pain.  No orthopnea/PND.  Mild lightheadedness rarely if he stands too fast. Weight is down 4 lbs. SBP 90s-100s at home.   Labs (5/19): K 4.4, creatinine 0.65  Labs (6/19): K 3.8, creatinine 0.69, LDL 16, TGs 300, HDL 33 Labs (8/19): K 3.8, creatinine 0.68, digoxin 0.7, LDL 28, HDL 30  PMH: 1. 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  - Echo (3/19): EF 15-20% with moderate LV dilation, mildly dilated RV with moderately decreased RV systolic function. The patient also has a bicuspid aortic valve with moderate AI. The ascending aorta is dilated to 3.9 cm.  - Cardiac MRI (3/19): Severely dilated LV with LVEF 21%, mildly dilated RV with mildly decreased systolic function. Patchy mid-wall LGE: possible viral myocarditis. - LHC/RHC (3/19): Extensive moderate but not critical CAD. I would not expect this to have caused his EF to fall so markedly. On RHC, filling pressures were elevated with markedly low cardiac output and  elevated SVR. PAPi was low at 1.4 and CVP/PCWP high at 0.62 suggesting significant co-existing RV dysfunction.  - Echo (6/19): EF 35-40%, moderate diastolic dysfunction, moderate AI, bicuspid aortic valve, mildly decreased RV systolic function.  - Echo (12/19): EF 45% with mild LV dilation, mild LVH, moderate diastolic dysfunction, bicuspid aortic valve with moderate AI.  2. Bicuspid aortic valve: Echo in 12/19 showed moderate aortic insufficiency.  3. CAD: LHC (3/19) with 50% pLAD, 50% ostial ramus, 85% mid LCx, totally occluded OM2 with collaterals, 60% mRCA, 50% dRCA.  4. Type II diabetes  Review of systems complete and found to be negative unless listed in HPI.   SH:  Social History   Socioeconomic History  . Marital status: Single    Spouse name: Not on file  . Number of children: Not on file  . Years of education: Not on file  . Highest education level: Not on file  Occupational History  . Occupation: Curator  Social Needs  . Financial resource strain: Not on file  . Food insecurity:    Worry: Not on file    Inability: Not on file  . Transportation needs:    Medical: Not on file    Non-medical: Not on file  Tobacco Use  . Smoking status: Never Smoker  . Smokeless tobacco: Never Used  Substance and Sexual Activity  . Alcohol use: No    Frequency: Never    Comment: remote occasional use  . Drug use: No    Comment: remote use of "weed edibles", more heavy  abuse in college  . Sexual activity: Never    Partners: Female    Comment: last 2 years ago  Lifestyle  . Physical activity:    Days per week: Not on file    Minutes per session: Not on file  . Stress: Not on file  Relationships  . Social connections:    Talks on phone: Not on file    Gets together: Not on file    Attends religious service: Not on file    Active member of club or organization: Not on file    Attends meetings of clubs or organizations: Not on file    Relationship status: Not on file  .  Intimate partner violence:    Fear of current or ex partner: Not on file    Emotionally abused: Not on file    Physically abused: Not on file    Forced sexual activity: Not on file  Other Topics Concern  . Not on file  Social History Narrative  . Not on file    FH:  Family History  Problem Relation Age of Onset  . Mitral valve prolapse Mother   . Supraventricular tachycardia Mother   . CAD Father 80       CABG, stent  . Hypertension Father   . Heart failure Maternal Grandfather   . Congestive Heart Failure Paternal Uncle        has pacer, defibrillator  . CAD Paternal Grandfather   . CAD Maternal Grandmother   . CAD Maternal Uncle     Current Outpatient Medications  Medication Sig Dispense Refill  . albuterol (PROVENTIL HFA;VENTOLIN HFA) 108 (90 Base) MCG/ACT inhaler Inhale 2 puffs into the lungs every 6 (six) hours as needed for wheezing or shortness of breath.    Marland Kitchen aspirin 81 MG chewable tablet Chew 1 tablet (81 mg total) by mouth daily. 30 tablet 6  . atorvastatin (LIPITOR) 80 MG tablet Take 1 tablet (80 mg total) by mouth daily at 6 PM. 30 tablet 6  . carvedilol (COREG) 12.5 MG tablet TAKE 1 TABLET BY MOUTH TWICE DAILY WITH A MEAL 60 tablet 3  . Cholecalciferol (VITAMIN D3) 5000 units CAPS Take 5,000 Units by mouth daily.    . digoxin (LANOXIN) 0.125 MG tablet TAKE 1 TABLET BY MOUTH ONCE DAILY 30 tablet 6  . fluticasone (FLONASE) 50 MCG/ACT nasal spray Place 1 spray into both nostrils daily.    . furosemide (LASIX) 20 MG tablet Take 40 mg by mouth daily.    Marland Kitchen METFORMIN HCL ER PO Take 1,000 mg by mouth 2 (two) times daily.     . Multiple Vitamin (MULTIVITAMIN) tablet Take 1 tablet by mouth daily.    Marland Kitchen omega-3 acid ethyl esters (LOVAZA) 1 g capsule Take 1 g by mouth 2 (two) times daily.    . sacubitril-valsartan (ENTRESTO) 97-103 MG Take 1 tablet by mouth 2 (two) times daily. 60 tablet 3  . sertraline (ZOLOFT) 100 MG tablet Take 100 mg by mouth daily.    Marland Kitchen spironolactone  (ALDACTONE) 25 MG tablet TAKE 1 TABLET BY MOUTH ONCE DAILY 30 tablet 6   No current facility-administered medications for this encounter.     Vitals:   11/02/18 1155  BP: 118/60  Pulse: 67  SpO2: 99%  Weight: 99.1 kg (218 lb 6.4 oz)   Filed Weights   11/02/18 1155  Weight: 99.1 kg (218 lb 6.4 oz)   Wt Readings from Last 3 Encounters:  11/02/18 99.1 kg (218 lb 6.4  oz)  07/02/18 100.7 kg (222 lb)  05/26/18 100.7 kg (222 lb)    PHYSICAL EXAM: General: NAD Neck: No JVD, no thyromegaly or thyroid nodule.  Lungs: Clear to auscultation bilaterally with normal respiratory effort. CV: Nondisplaced PMI.  Heart regular S1/S2, no S3/S4, 1/6 SEM RUSB (no diastolic murmur).  No peripheral edema.  No carotid bruit.  Normal pedal pulses.  Abdomen: Soft, nontender, no hepatosplenomegaly, no distention.  Skin: Intact without lesions or rashes.  Neurologic: Alert and oriented x 3.  Psych: Normal affect. Extremities: No clubbing or cyanosis.  HEENT: Normal.   ASSESSMENT & PLAN:  1. Chronic systolic CHF: Primarily nonischemic cardiomyopathy, possibly viral myocarditis based on cardiac MRI.  He also has extensive but not critical CAD, cardiomyopathy is well out of proportion to CAD.  Echo in 6/19 showed EF up to 35-40% and echo in 12/19 showed EF 45%.  NYHA class II symptoms, he is not volume overloaded.  - He appears to be out of range for ICD.   - Continue Coreg 12.5 mg bid, no BP room today to increase.  - Continue digoxin, check level today.  - Continue Entresto 97/103 bid and spironolactone 25 mg daily. BMET today.  - Continue Lasix 40 mg daily 2. CAD: LHC 01/2018 showed significant but not critical 3-vessel coronary artery disease. No chest pain.  - Continue ASA 81 daily.  - Continue statin, good lipids in 8/19.  3. Bicuspid Aortic Valve Disorder: Echo in 12/19 with moderate AI and bicuspid aortic valve.  Ascending aorta mildly dilated 4.2 cm. Follow over time.  4. DM: Dapagliflozin or  empagliflozin would be a good choice for DM management given CHF. He is currently on metformin.   - He does not have insurance coverage for an SGLT-2 inhibitor, planning to get Medicaid. I will reassess him for SGLT2-inhitor at next appt.   Followup in 4 months  Marca Ancona, MD 11/02/2018

## 2018-12-29 ENCOUNTER — Other Ambulatory Visit (HOSPITAL_COMMUNITY): Payer: Self-pay | Admitting: Internal Medicine

## 2019-01-09 ENCOUNTER — Other Ambulatory Visit (HOSPITAL_COMMUNITY): Payer: Self-pay | Admitting: Cardiology

## 2019-01-29 ENCOUNTER — Other Ambulatory Visit (HOSPITAL_COMMUNITY): Payer: Self-pay | Admitting: Internal Medicine

## 2019-03-01 ENCOUNTER — Telehealth (HOSPITAL_COMMUNITY): Payer: Self-pay | Admitting: Licensed Clinical Social Worker

## 2019-03-01 ENCOUNTER — Other Ambulatory Visit: Payer: Self-pay

## 2019-03-01 ENCOUNTER — Encounter (HOSPITAL_COMMUNITY): Payer: Self-pay

## 2019-03-01 ENCOUNTER — Ambulatory Visit (HOSPITAL_COMMUNITY)
Admission: RE | Admit: 2019-03-01 | Discharge: 2019-03-01 | Disposition: A | Discharge status: Home / Self Care | Payer: No Typology Code available for payment source | Source: Ambulatory Visit | Attending: Cardiology | Admitting: Cardiology

## 2019-03-01 VITALS — Wt 210.0 lb

## 2019-03-01 DIAGNOSIS — I5042 Chronic combined systolic (congestive) and diastolic (congestive) heart failure: Secondary | ICD-10-CM

## 2019-03-01 DIAGNOSIS — E785 Hyperlipidemia, unspecified: Secondary | ICD-10-CM

## 2019-03-01 DIAGNOSIS — I2510 Atherosclerotic heart disease of native coronary artery without angina pectoris: Secondary | ICD-10-CM

## 2019-03-01 NOTE — Progress Notes (Signed)
Heart Failure TeleHealth Note  Due to national recommendations of social distancing due to COVID 19, Audio/video telehealth visit is felt to be most appropriate for this patient at this time.  See MyChart message from today for patient consent regarding telehealth for [facility].  Date:  03/01/2019   ID:  Curtis [name], DOB 1976-02-27, MRN [id number]  Location: Home  Provider location: [place] [facility] Heart Failure Type of Visit: Established patient   PCP:  Gweneth Dimitri, MD  Cardiologist:  No primary care provider on file. Primary HF: Dr Shirlee Latch   Chief Complaint: Heart Failure   History of Present Illness: Curtis [name] is a 43 y.o. male with a history of HTN, asthma, HL, DM, and chronic systolic HF.  He was admitted in 3/19 with dyspnea and volume overload and diagnosed with systolic CHF and cardiogenic shock.  Coronary angiography showed extensive but not critical CAD, did not explain extent of cardiomyopathy.  Cardiac MRI showed EF 21% with patchy mid-wall LGE pattern concerning for myocarditis. He was on short term milrinone but able to wean off. Had SVT/PVCs on milrinone but resolved once milrinone was weaned off. Discharge weight 219 pounds.  He was also noted to have a bicuspid aortic valve with moderate aortic insufficiency.   Echo in 6/19 showed EF up to 35-40%.   ECHO 12/19. EF 45%. Curtis [name]   He  presents via Special educational needs teacher for a telehealth visit today.  Overall feeling fine. Occasionally has joint discomfort. Denies SOB/PND/Orthopnea. Appetite ok. No fever or chills. Weight at home 210 pounds. Able to walk 2 miles without difficulty. Taking all medications but needs assistance with entresto.   He  denies symptoms worrisome for COVID 19.   Past Medical History:  Diagnosis Date  . Asthma   . CAD (coronary artery disease) 02/04/2018   LHC 02/04/18: multivessel CAD, 50% prox LAD, 80-90% mid LCx at take off of CTO'ed OM branch, 60-70% distal RCA stenosis  .  Diabetes mellitus without complication (HCC)   . Hyperlipidemia   . Hypertension   . MRSA (methicillin resistant Staphylococcus aureus) 2014   nose leison MRSA positive  . Systolic heart failure (HCC) 02/03/2018   EF 15-20% on echo 02/03/18   Past Surgical History:  Procedure Laterality Date  . ABSCESS DRAINAGE     MRSA inside nares  . APPENDECTOMY    . RIGHT/LEFT HEART CATH AND CORONARY ANGIOGRAPHY N/A 02/04/2018   Procedure: RIGHT/LEFT HEART CATH AND CORONARY ANGIOGRAPHY;  Surgeon: Yvonne Kendall, MD;  Location: [facility] INVASIVE CV [facility];  Service: Cardiovascular;  Laterality: N/A;     Current Outpatient Medications  Medication Sig Dispense Refill  . albuterol (PROVENTIL HFA;VENTOLIN HFA) 108 (90 Base) MCG/ACT inhaler Inhale 2 puffs into the lungs every 6 (six) hours as needed for wheezing or shortness of breath.    Curtis [name] aspirin 81 MG chewable tablet Chew 1 tablet (81 mg total) by mouth daily. 30 tablet 6  . atorvastatin (LIPITOR) 80 MG tablet Take 1 tablet (80 mg total) by mouth daily at 6 PM. 30 tablet 6  . carvedilol (COREG) 12.5 MG tablet TAKE 1 TABLET BY MOUTH TWICE DAILY WITH A MEAL 180 tablet 3  . Cholecalciferol (VITAMIN D3) 5000 units CAPS Take 5,000 Units by mouth daily.    . digoxin (LANOXIN) 0.125 MG tablet TAKE 1 TABLET BY MOUTH ONCE DAILY 30 tablet 6  . furosemide (LASIX) 20 MG tablet TAKE 2 TABLETS BY MOUTH ONCE DAILY 180 tablet 0  . METFORMIN HCL  ER PO Take 1,000 mg by mouth 2 (two) times daily.     . Multiple Vitamin (MULTIVITAMIN) tablet Take 1 tablet by mouth daily.    Curtis [name]. omega-3 acid ethyl esters (LOVAZA) 1 g capsule Take 1 g by mouth 2 (two) times daily.    . sacubitril-valsartan (ENTRESTO) 97-103 MG Take 1 tablet by mouth 2 (two) times daily. 60 tablet 3  . sertraline (ZOLOFT) 100 MG tablet Take 100 mg by mouth daily.    Curtis [name]. spironolactone (ALDACTONE) 25 MG tablet TAKE 1 TABLET BY MOUTH ONCE DAILY 30 tablet 6  . fluticasone (FLONASE) 50 MCG/ACT nasal spray Place 1 spray  into both nostrils daily.     No current facility-administered medications for this encounter.     Allergies:   Patient has no known allergies.   Social History:  The patient  reports that he has never smoked. He has never used smokeless tobacco. He reports that he does not drink alcohol or use drugs.   Family History:  The patient's family history includes CAD in his maternal grandmother, maternal uncle, and paternal grandfather; CAD (age of onset: 1458) in his father; Congestive Heart Failure in his paternal uncle; Heart failure in his maternal grandfather; Hypertension in his father; Mitral valve prolapse in his mother; Supraventricular tachycardia in his mother.   ROS:  Please see the history of present illness.   All other systems are personally reviewed and negative.   Exam:  Tele Health Call; Exam is subjective General:  Speaks in full sentences. No resp difficulty. Lungs: Normal respiratory effort with conversation.  Abdomen: Non-distended per patient report Extremities: Pt denies edema. Neuro: Alert & oriented x 3.   Recent Labs: 11/02/2018: BUN 14; Creatinine, Ser 0.91; Potassium 4.2; Sodium 139  Personally reviewed   Wt Readings from Last 3 Encounters:  03/01/19 95.3 kg (210 lb)  11/02/18 99.1 kg (218 lb 6.4 oz)  07/02/18 100.7 kg (222 lb)      ASSESSMENT AND PLAN:  1. Chronic systolic CHF: Primarily nonischemic cardiomyopathy, possibly viral myocarditis based on cardiac MRI.  He also has extensive but not critical CAD, cardiomyopathy is well out of proportion to CAD.  Echo in 6/19 showed EF up to 35-40% and echo in 12/19 showed EF 45%.  -he is out of range for ICD.    -NYHA I.  Continue Coreg 12.5 mg bid - Stop digoxin.   - Continue Entresto 97/103 bid and spironolactone 25 mg daily.  - Continue Lasix 40 mg daily  2. CAD: LHC 01/2018 showed significant but not critical 3-vessel coronary artery disease. No chest pain.  - Continue ASA 81 daily.  - Continue statin,  good lipids in 8/19.    3. Bicuspid Aortic Valve Disorder: Echo in 12/19 with moderate AI and bicuspid aortic valve.  Ascending aorta mildly dilated 4.2 cm. Follow over time.   4. DM: Dapagliflozin or empagliflozin would be a good choice for DM management given CHF. He is currently on metformin.   - He does not have insurance coverage for an SGLT-2 inhibitor  COVID screen The patient does not have any symptoms that suggest any further testing/ screening at this time.  Social distancing reinforced today.  Relevant cardiac medications were reviewed at length with the patient today.   The patient does not have concerns regarding their medications at this time. I will ask HFSW to follow up regarding entresto.   The following changes were made today:  Stop  Recommended follow-up:  3-4  months  with Dr Shirlee Latch. He will need lab work at that time.   Today, I have spent 15 minutes with the patient with telehealth technology discussing the above issues .    Waneta Martins, NP  03/01/2019 2:38 PM  [facility] [address] [facility] [place] [address] [phone] (office) [phone] (fax)

## 2019-03-01 NOTE — Addendum Note (Signed)
Encounter addended by: Marisa Hua, RN on: 03/01/2019 3:25 PM  Actions taken: Order list changed, Medication long-term status modified, Clinical Note Signed

## 2019-03-01 NOTE — Telephone Encounter (Signed)
CSW consulted to follow up with pt regarding Entresto concerns.    Pt reports it is time for him to renew his [employer] application (expires 03/10/2019) and he does not know how to proceed since everything is closed.  CSW walked pt through application process and provided CSW email so patient can scan completed application and send directly to CSW- pt expressed understanding  CSW will continue to follow and assist as needed  Burna Sis, LCSW Clinical Social Worker [facility] Desk#: [phone] Cell#: [phone]

## 2019-03-01 NOTE — Progress Notes (Signed)
Spoke with patient, discussed visit summary.  Questions answered. AVS mailed

## 2019-03-01 NOTE — Patient Instructions (Addendum)
You have been referred to the Child psychotherapist for [employer] assistance. She will give you a call to discuss  Stop digoxin  Your physician recommends that you schedule a follow-up appointment in: 3-4 months with Dr Shirlee Latch. Labs will be drawn at that visit.

## 2019-03-09 ENCOUNTER — Telehealth (HOSPITAL_COMMUNITY): Payer: Self-pay | Admitting: Licensed Clinical Social Worker

## 2019-03-09 NOTE — Telephone Encounter (Signed)
Pt emailed CSW completed [employer] application- CSW sent to clinic to have Physician portion completed  CSW will continue to follow and assist as needed  Burna Sis, LCSW Clinical Social Worker [facility] Desk#: [phone] Cell#: [phone]

## 2019-03-16 ENCOUNTER — Telehealth (HOSPITAL_COMMUNITY): Payer: No Typology Code available for payment source

## 2019-03-24 ENCOUNTER — Other Ambulatory Visit (HOSPITAL_COMMUNITY): Payer: Self-pay

## 2019-03-24 ENCOUNTER — Telehealth (HOSPITAL_COMMUNITY): Payer: Self-pay

## 2019-03-24 ENCOUNTER — Telehealth (HOSPITAL_COMMUNITY): Payer: Self-pay | Admitting: Licensed Clinical Social Worker

## 2019-03-24 MED ORDER — SACUBITRIL-VALSARTAN 97-103 MG PO TABS: 1.0000 | ORAL_TABLET | Freq: Two times a day (BID) | ORAL | 3 refills | Status: DC

## 2019-03-24 NOTE — Telephone Encounter (Signed)
2 bottles of samples given. Entresto 49/51. Lot #[id number]. Expiration 06/22. Samples to be picked up at front desk.

## 2019-03-24 NOTE — Telephone Encounter (Signed)
CSW received message from pt that he only has about 2 days left of his Sherryll Burger and he is unable to get more through [employer] at this time as his prescription has expired.  CSW contacted clinic to send in updated escript to the [facility] pharmacy and requested they place samples of Entresto at the front desk to last the patient until a new order from [facility] can be processed- pt updated  CSW will continue to follow and assist as needed  Burna Sis, LCSW Clinical Social Worker [facility] Desk#: [phone] Cell#: [phone]

## 2019-03-24 NOTE — Addendum Note (Signed)
Addended by: Marisa Hua on: 03/24/2019 09:07 AM   Modules accepted: Orders

## 2019-04-08 ENCOUNTER — Other Ambulatory Visit (HOSPITAL_COMMUNITY): Payer: Self-pay | Admitting: Cardiology

## 2019-04-15 ENCOUNTER — Telehealth (HOSPITAL_COMMUNITY): Payer: Self-pay

## 2019-04-15 NOTE — Telephone Encounter (Signed)
PAN application for Entresto 97/103 faxed, confirmation received.

## 2019-04-17 ENCOUNTER — Other Ambulatory Visit (HOSPITAL_COMMUNITY): Payer: Self-pay | Admitting: Student

## 2019-04-20 NOTE — Telephone Encounter (Signed)
Received fax from [employer], pt has been approved to receive Entresto for free the remainder of the calendar year.

## 2019-05-20 ENCOUNTER — Other Ambulatory Visit (HOSPITAL_COMMUNITY): Payer: Self-pay | Admitting: Cardiology

## 2019-05-23 ENCOUNTER — Encounter (HOSPITAL_COMMUNITY): Payer: No Typology Code available for payment source | Admitting: Cardiology

## 2019-05-24 ENCOUNTER — Other Ambulatory Visit (HOSPITAL_COMMUNITY): Payer: Self-pay | Admitting: Cardiology

## 2019-06-20 ENCOUNTER — Other Ambulatory Visit (HOSPITAL_COMMUNITY): Payer: Self-pay | Admitting: Cardiology

## 2019-11-01 ENCOUNTER — Telehealth (HOSPITAL_COMMUNITY): Payer: Self-pay | Admitting: Pharmacy Technician

## 2019-11-01 NOTE — Telephone Encounter (Signed)
It's time to re-enroll patient to receive medication assistance for Entresto from [employer]. Left voicemail for patient to call me back so that we can start the re-enrollment process.  Will follow up.  Lavonya Hoerner F Nolin Grell, CPhT   

## 2019-12-07 NOTE — Telephone Encounter (Signed)
Called and left another message to start patient assistance. Will be here to assist in the future when patient calls back.  Archer Asa, CPhT

## 2019-12-08 ENCOUNTER — Telehealth (HOSPITAL_COMMUNITY): Payer: Self-pay | Admitting: Pharmacy Technician

## 2019-12-08 NOTE — Telephone Encounter (Signed)
Returned patient's call regarding [employer] patient assistance. Left voicemail with different ways he could get the application to the office to send in.  Archer Asa, CPhT

## 2020-02-04 ENCOUNTER — Other Ambulatory Visit (HOSPITAL_COMMUNITY): Payer: Self-pay | Admitting: Internal Medicine

## 2020-02-10 ENCOUNTER — Ambulatory Visit: Payer: No Typology Code available for payment source | Attending: Internal Medicine

## 2020-02-10 DIAGNOSIS — Z23 Encounter for immunization: Secondary | ICD-10-CM

## 2020-02-10 NOTE — Progress Notes (Signed)
   Covid-19 Vaccination Clinic  Name:  Curtis [name]    MRN: [id number] DOB: 1976-06-07  02/10/2020  Curtis [name] was observed post Covid-19 immunization for 15 minutes without incident. He was provided with Vaccine Information Sheet and instruction to access the V-Safe system.   Curtis [name] was instructed to call 911 with any severe reactions post vaccine: Marland Kitchen Difficulty breathing  . Swelling of face and throat  . A fast heartbeat  . A bad rash all over body  . Dizziness and weakness   Immunizations Administered    Name Date Dose VIS Date Route   Pfizer COVID-19 Vaccine 02/10/2020  1:22 PM 0.3 mL 10/28/2019 Intramuscular   Manufacturer: [employer], [employer]   Lot: [id number]   NDC: [address]-2

## 2020-02-28 ENCOUNTER — Other Ambulatory Visit (HOSPITAL_COMMUNITY): Payer: Self-pay | Admitting: Cardiology

## 2020-03-06 ENCOUNTER — Ambulatory Visit: Payer: No Typology Code available for payment source | Attending: Internal Medicine

## 2020-03-06 DIAGNOSIS — Z23 Encounter for immunization: Secondary | ICD-10-CM

## 2020-03-06 NOTE — Progress Notes (Signed)
   Covid-19 Vaccination Clinic  Name:  Curtis [name]    MRN: [id number] DOB: 1976/09/06  03/06/2020  Curtis [name] was observed post Covid-19 immunization for 15 minutes without incident. He was provided with Vaccine Information Sheet and instruction to access the V-Safe system.   Curtis [name] was instructed to call 911 with any severe reactions post vaccine: Marland Kitchen Difficulty breathing  . Swelling of face and throat  . A fast heartbeat  . A bad rash all over body  . Dizziness and weakness   Immunizations Administered    Name Date Dose VIS Date Route   Pfizer COVID-19 Vaccine 03/06/2020  1:00 PM 0.3 mL 01/11/2019 Intramuscular   Manufacturer: [employer], [employer]   Lot: [id number]   NDC: [address]-2

## 2020-05-08 ENCOUNTER — Telehealth (HOSPITAL_COMMUNITY): Payer: Self-pay | Admitting: Vascular Surgery

## 2020-05-08 ENCOUNTER — Other Ambulatory Visit (HOSPITAL_COMMUNITY): Payer: Self-pay | Admitting: Cardiology

## 2020-05-08 NOTE — Telephone Encounter (Signed)
Pt need refill lasix

## 2020-05-09 MED ORDER — FUROSEMIDE 20 MG PO TABS: 40.0000 mg | ORAL_TABLET | Freq: Every day | ORAL | 0 refills | Status: DC

## 2020-05-09 NOTE — Telephone Encounter (Signed)
Refill sent.

## 2020-06-08 ENCOUNTER — Other Ambulatory Visit (HOSPITAL_COMMUNITY): Payer: Self-pay | Admitting: *Deleted

## 2020-06-08 MED ORDER — CARVEDILOL 12.5 MG PO TABS: 12.5000 mg | ORAL_TABLET | Freq: Two times a day (BID) | ORAL | 0 refills | Status: DC

## 2020-06-13 ENCOUNTER — Other Ambulatory Visit (HOSPITAL_COMMUNITY): Payer: Self-pay

## 2020-06-13 MED ORDER — SACUBITRIL-VALSARTAN 97-103 MG PO TABS: 1.0000 | ORAL_TABLET | Freq: Two times a day (BID) | ORAL | 3 refills | Status: DC

## 2020-06-18 ENCOUNTER — Ambulatory Visit (HOSPITAL_COMMUNITY)
Admission: RE | Admit: 2020-06-18 | Discharge: 2020-06-18 | Disposition: A | Discharge status: Home / Self Care | Payer: 59 | Source: Ambulatory Visit | Attending: Cardiology | Admitting: Cardiology

## 2020-06-18 ENCOUNTER — Other Ambulatory Visit: Payer: Self-pay

## 2020-06-18 ENCOUNTER — Telehealth (HOSPITAL_COMMUNITY): Payer: Self-pay | Admitting: Pharmacy Technician

## 2020-06-18 ENCOUNTER — Encounter (HOSPITAL_COMMUNITY): Payer: Self-pay | Admitting: Cardiology

## 2020-06-18 VITALS — BP 98/60 | HR 79 | Ht 76.0 in | Wt 233.2 lb

## 2020-06-18 DIAGNOSIS — I110 Hypertensive heart disease with heart failure: Secondary | ICD-10-CM | POA: Diagnosis not present

## 2020-06-18 DIAGNOSIS — I2510 Atherosclerotic heart disease of native coronary artery without angina pectoris: Secondary | ICD-10-CM | POA: Diagnosis not present

## 2020-06-18 DIAGNOSIS — E119 Type 2 diabetes mellitus without complications: Secondary | ICD-10-CM | POA: Insufficient documentation

## 2020-06-18 DIAGNOSIS — J45909 Unspecified asthma, uncomplicated: Secondary | ICD-10-CM | POA: Insufficient documentation

## 2020-06-18 DIAGNOSIS — I5042 Chronic combined systolic (congestive) and diastolic (congestive) heart failure: Secondary | ICD-10-CM | POA: Diagnosis not present

## 2020-06-18 DIAGNOSIS — Z7982 Long term (current) use of aspirin: Secondary | ICD-10-CM | POA: Insufficient documentation

## 2020-06-18 DIAGNOSIS — I509 Heart failure, unspecified: Secondary | ICD-10-CM | POA: Diagnosis not present

## 2020-06-18 DIAGNOSIS — E785 Hyperlipidemia, unspecified: Secondary | ICD-10-CM | POA: Insufficient documentation

## 2020-06-18 DIAGNOSIS — Z8249 Family history of ischemic heart disease and other diseases of the circulatory system: Secondary | ICD-10-CM | POA: Insufficient documentation

## 2020-06-18 DIAGNOSIS — Z79899 Other long term (current) drug therapy: Secondary | ICD-10-CM | POA: Insufficient documentation

## 2020-06-18 DIAGNOSIS — I5022 Chronic systolic (congestive) heart failure: Secondary | ICD-10-CM | POA: Diagnosis present

## 2020-06-18 DIAGNOSIS — Q231 Congenital insufficiency of aortic valve: Secondary | ICD-10-CM

## 2020-06-18 DIAGNOSIS — Z7984 Long term (current) use of oral hypoglycemic drugs: Secondary | ICD-10-CM | POA: Diagnosis not present

## 2020-06-18 DIAGNOSIS — I428 Other cardiomyopathies: Secondary | ICD-10-CM | POA: Insufficient documentation

## 2020-06-18 LAB — LIPID PANEL
Cholesterol: 95 mg/dL (ref 0–200)
HDL: 33 mg/dL — ABNORMAL LOW (ref >40)
LDL Cholesterol: 26 mg/dL (ref 0–99)
Total CHOL/HDL Ratio: 2.9 RATIO
Triglycerides: 181 mg/dL — ABNORMAL HIGH (ref <150)
VLDL: 36 mg/dL (ref 0–40)

## 2020-06-18 LAB — CBC
HCT: 42.1 % (ref 39.0–52.0)
Hemoglobin: 14.6 g/dL (ref 13.0–17.0)
MCH: 31.2 pg (ref 26.0–34.0)
MCHC: 34.7 g/dL (ref 30.0–36.0)
MCV: 90 fL (ref 80.0–100.0)
Platelets: 271 10*3/uL (ref 150–400)
RBC: 4.68 MIL/uL (ref 4.22–5.81)
RDW: 12.5 % (ref 11.5–15.5)
WBC: 11.4 10*3/uL — ABNORMAL HIGH (ref 4.0–10.5)
nRBC: 0 % (ref 0.0–0.2)

## 2020-06-18 LAB — BASIC METABOLIC PANEL
Anion gap: 13 (ref 5–15)
BUN: 14 mg/dL (ref 6–20)
CO2: 18 mmol/L — ABNORMAL LOW (ref 22–32)
Calcium: 9.2 mg/dL (ref 8.9–10.3)
Chloride: 107 mmol/L (ref 98–111)
Creatinine, Ser: 0.85 mg/dL (ref 0.61–1.24)
GFR calc Af Amer: >60 mL/min (ref >60)
GFR calc non Af Amer: >60 mL/min (ref >60)
Glucose, Bld: 140 mg/dL — ABNORMAL HIGH (ref 70–99)
Potassium: 4.1 mmol/L (ref 3.5–5.1)
Sodium: 138 mmol/L (ref 135–145)

## 2020-06-18 LAB — TSH: TSH: 3.243 u[IU]/mL (ref 0.350–4.500)

## 2020-06-18 MED ORDER — DAPAGLIFLOZIN PROPANEDIOL 10 MG PO TABS
10.0000 mg | ORAL_TABLET | Freq: Every day | ORAL | 11 refills | Status: DC
Start: 2020-06-18 — End: 2020-07-03

## 2020-06-18 MED ORDER — FUROSEMIDE 20 MG PO TABS: 20.0000 mg | ORAL_TABLET | Freq: Every day | ORAL | 3 refills | Status: DC

## 2020-06-18 NOTE — Telephone Encounter (Signed)
Patient Advocate Encounter   Received notification from [employer] that prior authorization for Marcelline Deist is required.   PA submitted on CoverMyMeds Key  [id number] Status is pending   Will continue to follow.

## 2020-06-18 NOTE — Patient Instructions (Addendum)
STOP Glimepiride  START Farxiga 10 mg, one tab daily DECREASE Lasix to 20 mg, one tab daily  Labs today We will only contact you if something comes back abnormal or we need to make some changes. Otherwise no news is good news!  Labs needed in 3 months  Your physician has requested that you have an echocardiogram. Echocardiography is a painless test that uses sound waves to create images of your heart. It provides your doctor with information about the size and shape of your heart and how well your heart's chambers and valves are working. This procedure takes approximately one hour. There are no restrictions for this procedure.  Your physician recommends that you schedule a follow-up appointment in: 6 months  Do the following things EVERYDAY: 1) Weigh yourself in the morning before breakfast. Write it down and keep it in a log. 2) Take your medicines as prescribed 3) Eat low salt foods--Limit salt (sodium) to 2000 mg per day.  4) Stay as active as you can everyday 5) Limit all fluids for the day to less than 2 liters  If you have any questions or concerns before your next appointment please send [place] a message through [place] or call our office at [phone].    TO LEAVE A MESSAGE FOR THE NURSE SELECT OPTION 2, PLEASE LEAVE A MESSAGE INCLUDING: . YOUR NAME . DATE OF BIRTH . CALL BACK NUMBER . REASON FOR CALL**this is important as we prioritize the call backs  YOU WILL RECEIVE A CALL BACK THE SAME DAY AS LONG AS YOU CALL BEFORE 4:00 PM

## 2020-06-18 NOTE — Progress Notes (Signed)
PCP: Dr Uvaldo Rising Primary Cardiologist: Dr Shirlee Latch   HPI: Curtis [name] is a 44 y.o. malewith a history of HTN, asthma, HL, DM, and chronic systolic HF.  He was admitted in 3/19 with dyspnea and volume overload and diagnosed with systolic CHF and cardiogenic shock.  Coronary angiography showed extensive but not critical CAD, did not explain extent of cardiomyopathy.  Cardiac MRI showed EF 21% with patchy mid-wall LGE pattern concerning for myocarditis. He was on short term milrinone but able to wean off. Had SVT/PVCs on milrinone but resolved once milrinone was weaned off. Discharge weight 219 pounds.  He was also noted to have a bicuspid aortic valve with moderate aortic insufficiency.   Echo in 6/19 showed EF up to 35-40%.  Echo in 12/19 showed EF 45% with bicuspid aortic valve and moderate AI.   He returns today for followup of CHF.  Doing well overall.  Weight is up 15 lbs but he thinks a lot of this is due to muscle.  No significant exertional dyspnea or chest pain.  He does fatigue easily at times.  Rare lightheadedness if he stands up too fast. No orthopnea/PND.   ECG (personally reviewed): NSR, lateral TWIs, PVC  Labs (5/19): K 4.4, creatinine 0.65  Labs (6/19): K 3.8, creatinine 0.69, LDL 16, TGs 300, HDL 33 Labs (8/19): K 3.8, creatinine 0.68, digoxin 0.7, LDL 28, HDL 30 Labs (12/19): K 4.2, creatinine 0.91  PMH: 1. 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  - Echo (3/19): EF 15-20% with moderate LV dilation, mildly dilated RV with moderately decreased RV systolic function. The patient also has a bicuspid aortic valve with moderate AI. The ascending aorta is dilated to 3.9 cm.  - Cardiac MRI (3/19): Severely dilated LV with LVEF 21%, mildly dilated RV with mildly decreased systolic function. Patchy mid-wall LGE: possible viral myocarditis. - LHC/RHC (3/19): Extensive moderate but not critical CAD. I would not expect this to have caused his EF to  fall so markedly. On RHC, filling pressures were elevated with markedly low cardiac output and elevated SVR. PAPi was low at 1.4 and CVP/PCWP high at 0.62 suggesting significant co-existing RV dysfunction.  - Echo (6/19): EF 35-40%, moderate diastolic dysfunction, moderate AI, bicuspid aortic valve, mildly decreased RV systolic function.  - Echo (12/19): EF 45% with mild LV dilation, mild LVH, moderate diastolic dysfunction, bicuspid aortic valve with moderate AI.  2. Bicuspid aortic valve: Echo in 12/19 showed moderate aortic insufficiency.  3. CAD: LHC (3/19) with 50% pLAD, 50% ostial ramus, 85% mid LCx, totally occluded OM2 with collaterals, 60% mRCA, 50% dRCA.  4. Type II diabetes  Review of systems complete and found to be negative unless listed in HPI.   SH:  Social History   Socioeconomic History  . Marital status: Single    Spouse name: Not on file  . Number of children: Not on file  . Years of education: Not on file  . Highest education level: Not on file  Occupational History  . Occupation: Curator  Tobacco Use  . Smoking status: Never Smoker  . Smokeless tobacco: Never Used  Substance and Sexual Activity  . Alcohol use: No    Comment: remote occasional use  . 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 Sexual activity: Never    Partners: Female    Comment: last 2 years ago  Other Topics Concern  . Not on file  Social History  Narrative  . Not on file   Social Determinants of Health   Financial Resource Strain:   . Difficulty of Paying Living Expenses:   Food Insecurity:   . Worried About Programme researcher, broadcasting/film/video in the Last Year:   . Barista in the Last Year:   Transportation Needs:   . Freight forwarder (Medical):   Marland Kitchen Lack of Transportation (Non-Medical):   Physical Activity:   . Days of Exercise per Week:   . Minutes of Exercise per Session:   Stress:   . Feeling of Stress :   Social Connections:   .  Frequency of Communication with Friends and Family:   . Frequency of Social Gatherings with Friends and Family:   . Attends Religious Services:   . Active Member of Clubs or Organizations:   . Attends Banker Meetings:   Marland Kitchen Marital Status:   Intimate Partner Violence:   . Fear of Current or Ex-Partner:   . Emotionally Abused:   Marland Kitchen Physically Abused:   . Sexually Abused:     FH:  Family History  Problem Relation Age of Onset  . Mitral valve prolapse Mother   . Supraventricular tachycardia Mother   . CAD Father 66       CABG, stent  . Hypertension Father   . Heart failure Maternal Grandfather   . Congestive Heart Failure Paternal Uncle        has pacer, defibrillator  . CAD Paternal Grandfather   . CAD Maternal Grandmother   . CAD Maternal Uncle     Current Outpatient Medications  Medication Sig Dispense Refill  . albuterol (PROVENTIL HFA;VENTOLIN HFA) 108 (90 Base) MCG/ACT inhaler Inhale 2 puffs into the lungs every 6 (six) hours as needed for wheezing or shortness of breath.    Marland Kitchen aspirin 81 MG chewable tablet Chew 1 tablet (81 mg total) by mouth daily. 30 tablet 6  . atorvastatin (LIPITOR) 80 MG tablet Take 1 tablet (80 mg total) by mouth daily at 6 PM. 30 tablet 6  . carvedilol (COREG) 12.5 MG tablet Take 1 tablet (12.5 mg total) by mouth 2 (two) times daily with a meal. Please call for an office visit [phone] 60 tablet 0  . Cholecalciferol (VITAMIN D3) 5000 units CAPS Take 5,000 Units by mouth daily.    . fluticasone (FLONASE) 50 MCG/ACT nasal spray Place 1 spray into both nostrils daily.    . furosemide (LASIX) 20 MG tablet Take 1 tablet (20 mg total) by mouth daily. 90 tablet 3  . metFORMIN (GLUCOPHAGE-XR) 500 MG 24 hr tablet Take 1,000 mg by mouth 2 (two) times daily.    . Multiple Vitamin (MULTIVITAMIN) tablet Take 1 tablet by mouth daily.    Marland Kitchen omega-3 acid ethyl esters (LOVAZA) 1 g capsule Take 1 g by mouth 2 (two) times daily.    . sacubitril-valsartan  (ENTRESTO) 97-103 MG Take 1 tablet by mouth 2 (two) times daily. 180 tablet 3  . sertraline (ZOLOFT) 100 MG tablet Take 100 mg by mouth daily.    Marland Kitchen spironolactone (ALDACTONE) 25 MG tablet Take 1 tablet by mouth once daily 90 tablet 3  . vitamin k 100 MCG tablet Take 100 mcg by mouth daily.    . dapagliflozin propanediol (FARXIGA) 10 MG TABS tablet Take 1 tablet (10 mg total) by mouth daily before breakfast. 30 tablet 11   No current facility-administered medications for this encounter.    Vitals:   06/18/20 1132  BP:  98/60  Pulse: 79  SpO2: 98%  Weight: 105.8 kg (233 lb 3.2 oz)  Height: 6\' 4"  (1.93 m)   Filed Weights   06/18/20 1132  Weight: 105.8 kg (233 lb 3.2 oz)   Wt Readings from Last 3 Encounters:  06/18/20 105.8 kg (233 lb 3.2 oz)  03/01/19 95.3 kg (210 lb)  11/02/18 99.1 kg (218 lb 6.4 oz)    PHYSICAL EXAM: General: NAD Neck: No JVD, no thyromegaly or thyroid nodule.  Lungs: Clear to auscultation bilaterally with normal respiratory effort. CV: Nondisplaced PMI.  Heart regular S1/S2, no S3/S4, 1/6 SEM RUSB.  No peripheral edema.  No carotid bruit.  Normal pedal pulses.  Abdomen: Soft, nontender, no hepatosplenomegaly, no distention.  Skin: Intact without lesions or rashes.  Neurologic: Alert and oriented x 3.  Psych: Normal affect. Extremities: No clubbing or cyanosis.  HEENT: Normal.   ASSESSMENT & PLAN:  1. Chronic systolic CHF: Primarily nonischemic cardiomyopathy, possibly viral myocarditis based on cardiac MRI.  He also has extensive but not critical CAD, cardiomyopathy is well out of proportion to CAD.  Echo in 6/19 showed EF up to 35-40% and echo in 12/19 showed EF 45%.  NYHA class II symptoms, he is not volume overloaded.  - Last echo was out of range for ICD.  - Repeat echo to reassess LV function.  - Continue Coreg 12.5 mg bid, no BP room today to increase.  - Continue Entresto 97/103 bid and spironolactone 25 mg daily. BMET today.  - Stop glimepiride,  start dapagliflozin 10 mg daily and decrease Lasix to 20 mg daily. BMET 10 days.  2. CAD: LHC 01/2018 showed significant but not critical 3-vessel coronary artery disease. No chest pain.  - Continue ASA 81 daily.  - Continue statin, check lipids today.   3. Bicuspid Aortic Valve Disorder: Echo in 12/19 with moderate AI and bicuspid aortic valve.  Ascending aorta mildly dilated 4.2 cm. - I will arrange for repeat echo.  4. DM: As above, start dapagliflozin and stop glimepiride.   Followup in 6 months if echo is stable but will need BMET in 3 months.   1/20, MD 06/18/2020

## 2020-06-19 NOTE — Telephone Encounter (Signed)
Advanced [facility] Failure Patient Advocate Encounter  Prior Authorization for Marcelline Deist has been approved.    Effective dates: 06/19/20 through 06/19/21  Patients co-pay is $520.27  I was able to obtain a copay card for the patient.  Pharmacy Billing Information  BIN:  [id number]  PCN: [id number]  Group: [id number]  ID: [id number]   Prior Authorization for Sherryll Burger has been approved.    Effective dates: 06/19/20 through 06/19/21  Patients co-pay is $200.  Was able to obtain a co-pay card for the patient.  Pharmacy Billing Information  BIN: [id number]  PCN: [id number]  Group: [id number]  ID: [id number]  Spoke with patient, will provide co-pay card information to pharmacy and send [facility] prescription over.  Spoke with the patient's pharmacy. Was able to provide [facility] co-pay card information. Upon giving the co-pay card information for Marcelline Deist, the patient still had a $325 co-pay. Will need to sign him up for patient assistance through [employer] or switch to Jardiance in order to use a co-pay card.  After speaking with Leotis Shames, [facility] we are going to change him over to Jardiance at this time. The co-pay for Jardiance was $25.  Pharmacy Billing Information  BIN: [id number]  PCN: [id number]  Group: [id number]  ID: [id number]  Called and left a message for the patient to call back about the medication change.

## 2020-06-19 NOTE — Telephone Encounter (Signed)
Patient Advocate Encounter   Received notification from [employer] that prior authorization for Curtis [name] is required.   PA submitted on CoverMyMeds Key  [id number] Status is pending   Will continue to follow.

## 2020-06-20 ENCOUNTER — Telehealth (HOSPITAL_COMMUNITY): Payer: Self-pay | Admitting: Pharmacist

## 2020-06-20 MED ORDER — SACUBITRIL-VALSARTAN 97-103 MG PO TABS: 1.0000 | ORAL_TABLET | Freq: Two times a day (BID) | ORAL | 3 refills | Status: DC

## 2020-06-20 NOTE — Telephone Encounter (Signed)
Entresto prescription sent to [facility].

## 2020-06-21 ENCOUNTER — Other Ambulatory Visit (HOSPITAL_COMMUNITY): Payer: Self-pay | Admitting: Cardiology

## 2020-06-25 ENCOUNTER — Other Ambulatory Visit: Payer: Self-pay

## 2020-06-25 ENCOUNTER — Ambulatory Visit (HOSPITAL_COMMUNITY)
Admission: RE | Admit: 2020-06-25 | Discharge: 2020-06-25 | Disposition: A | Discharge status: Home / Self Care | Payer: 59 | Source: Ambulatory Visit | Attending: Family Medicine | Admitting: Family Medicine

## 2020-06-25 DIAGNOSIS — Q231 Congenital insufficiency of aortic valve: Secondary | ICD-10-CM | POA: Insufficient documentation

## 2020-06-25 DIAGNOSIS — E119 Type 2 diabetes mellitus without complications: Secondary | ICD-10-CM | POA: Insufficient documentation

## 2020-06-25 DIAGNOSIS — E785 Hyperlipidemia, unspecified: Secondary | ICD-10-CM | POA: Insufficient documentation

## 2020-06-25 DIAGNOSIS — I110 Hypertensive heart disease with heart failure: Secondary | ICD-10-CM | POA: Insufficient documentation

## 2020-06-25 DIAGNOSIS — I509 Heart failure, unspecified: Secondary | ICD-10-CM

## 2020-06-25 NOTE — Progress Notes (Signed)
  Echocardiogram 2D Echocardiogram has been performed.  Celene Skeen 06/25/2020, 11:50 AM

## 2020-06-26 LAB — ECHOCARDIOGRAM COMPLETE: S' Lateral: 4.04 cm

## 2020-07-03 ENCOUNTER — Telehealth (HOSPITAL_COMMUNITY): Payer: Self-pay | Admitting: *Deleted

## 2020-07-03 MED ORDER — EMPAGLIFLOZIN 10 MG PO TABS
10.0000 mg | ORAL_TABLET | Freq: Every day | ORAL | 6 refills | Status: DC
Start: 2020-07-03 — End: 2022-01-23

## 2020-07-03 NOTE — Telephone Encounter (Signed)
Pt unable to afford Farxiga, rx changed to Jardiance instead per Dr Shirlee Latch

## 2020-07-03 NOTE — Telephone Encounter (Signed)
Spoke with the patient today. He can pick up Sherryll Burger and Jardiance from his pharmacy. The information for both co-pay cards have been provided to the pharmacy as well.  Archer Asa, CPhT

## 2020-07-07 ENCOUNTER — Other Ambulatory Visit (HOSPITAL_COMMUNITY): Payer: Self-pay | Admitting: Cardiology

## 2020-09-18 ENCOUNTER — Ambulatory Visit (HOSPITAL_COMMUNITY)
Admission: RE | Admit: 2020-09-18 | Discharge: 2020-09-18 | Disposition: A | Discharge status: Home / Self Care | Payer: No Typology Code available for payment source | Source: Ambulatory Visit | Attending: Cardiology | Admitting: Cardiology

## 2020-09-18 ENCOUNTER — Other Ambulatory Visit: Payer: Self-pay

## 2020-09-18 DIAGNOSIS — I509 Heart failure, unspecified: Secondary | ICD-10-CM | POA: Diagnosis present

## 2020-09-18 LAB — BASIC METABOLIC PANEL
Anion gap: 10 (ref 5–15)
BUN: 12 mg/dL (ref 6–20)
CO2: 25 mmol/L (ref 22–32)
Calcium: 9.5 mg/dL (ref 8.9–10.3)
Chloride: 103 mmol/L (ref 98–111)
Creatinine, Ser: 0.84 mg/dL (ref 0.61–1.24)
GFR, Estimated: >60 mL/min (ref >60)
Glucose, Bld: 158 mg/dL — ABNORMAL HIGH (ref 70–99)
Potassium: 3.9 mmol/L (ref 3.5–5.1)
Sodium: 138 mmol/L (ref 135–145)

## 2021-06-27 ENCOUNTER — Other Ambulatory Visit (HOSPITAL_COMMUNITY): Payer: Self-pay | Admitting: Cardiology

## 2021-06-30 ENCOUNTER — Other Ambulatory Visit (HOSPITAL_COMMUNITY): Payer: Self-pay | Admitting: Cardiology

## 2021-07-11 ENCOUNTER — Other Ambulatory Visit (HOSPITAL_COMMUNITY): Payer: Self-pay

## 2021-07-12 ENCOUNTER — Other Ambulatory Visit (HOSPITAL_COMMUNITY): Payer: Self-pay

## 2021-07-14 ENCOUNTER — Other Ambulatory Visit (HOSPITAL_COMMUNITY): Payer: Self-pay | Admitting: Cardiology

## 2021-07-21 ENCOUNTER — Other Ambulatory Visit (HOSPITAL_COMMUNITY): Payer: Self-pay | Admitting: Cardiology

## 2021-07-23 ENCOUNTER — Telehealth (HOSPITAL_COMMUNITY): Payer: Self-pay | Admitting: Pharmacy Technician

## 2021-07-23 NOTE — Telephone Encounter (Signed)
Advanced Heart Failure Patient Advocate Encounter  Patient called in requesting help with Jardiance. Stated the pharmacy explained that the co-pay card expired. Attempted to activate another co-pay card, the system would not let me. Called the patient's pharmacy. Someone used an incorrect co-pay card. Was able to help the representative find the correct card to bill. Co-pay $10  Called and updated the patient.   Archer Asa, CPhT

## 2021-07-30 ENCOUNTER — Telehealth (HOSPITAL_COMMUNITY): Payer: Self-pay | Admitting: Pharmacy Technician

## 2021-07-30 NOTE — Telephone Encounter (Signed)
Advanced Heart Failure Patient Advocate Encounter  Patient called in stating he was at the pharmacy and they did not have the co-pay card information for [facility]. Text the patient the billing information.   Patient confirmed receipt.  Archer Asa, CPhT

## 2021-08-16 ENCOUNTER — Other Ambulatory Visit (HOSPITAL_COMMUNITY): Payer: Self-pay | Admitting: Cardiology

## 2021-08-27 ENCOUNTER — Other Ambulatory Visit (HOSPITAL_COMMUNITY): Payer: Self-pay | Admitting: Cardiology

## 2021-09-06 ENCOUNTER — Ambulatory Visit (HOSPITAL_COMMUNITY)
Admission: RE | Admit: 2021-09-06 | Discharge: 2021-09-06 | Disposition: A | Discharge status: Home / Self Care | Payer: 59 | Source: Ambulatory Visit | Attending: Cardiology | Admitting: Cardiology

## 2021-09-06 ENCOUNTER — Other Ambulatory Visit: Payer: Self-pay

## 2021-09-06 ENCOUNTER — Encounter (HOSPITAL_COMMUNITY): Payer: Self-pay | Admitting: Cardiology

## 2021-09-06 VITALS — BP 100/50 | HR 60 | Wt 218.0 lb

## 2021-09-06 DIAGNOSIS — Z79899 Other long term (current) drug therapy: Secondary | ICD-10-CM | POA: Diagnosis not present

## 2021-09-06 DIAGNOSIS — Z7984 Long term (current) use of oral hypoglycemic drugs: Secondary | ICD-10-CM | POA: Diagnosis not present

## 2021-09-06 DIAGNOSIS — Q231 Congenital insufficiency of aortic valve: Secondary | ICD-10-CM | POA: Insufficient documentation

## 2021-09-06 DIAGNOSIS — I110 Hypertensive heart disease with heart failure: Secondary | ICD-10-CM | POA: Diagnosis present

## 2021-09-06 DIAGNOSIS — E119 Type 2 diabetes mellitus without complications: Secondary | ICD-10-CM | POA: Diagnosis not present

## 2021-09-06 DIAGNOSIS — Z8249 Family history of ischemic heart disease and other diseases of the circulatory system: Secondary | ICD-10-CM | POA: Insufficient documentation

## 2021-09-06 DIAGNOSIS — I428 Other cardiomyopathies: Secondary | ICD-10-CM | POA: Diagnosis not present

## 2021-09-06 DIAGNOSIS — I5022 Chronic systolic (congestive) heart failure: Secondary | ICD-10-CM | POA: Diagnosis not present

## 2021-09-06 DIAGNOSIS — I2510 Atherosclerotic heart disease of native coronary artery without angina pectoris: Secondary | ICD-10-CM | POA: Diagnosis not present

## 2021-09-06 DIAGNOSIS — Z7982 Long term (current) use of aspirin: Secondary | ICD-10-CM | POA: Diagnosis not present

## 2021-09-06 DIAGNOSIS — I5042 Chronic combined systolic (congestive) and diastolic (congestive) heart failure: Secondary | ICD-10-CM

## 2021-09-06 LAB — LIPID PANEL
Cholesterol: 122 mg/dL (ref 0–200)
HDL: 38 mg/dL — ABNORMAL LOW (ref >40)
LDL Cholesterol: 45 mg/dL (ref 0–99)
Total CHOL/HDL Ratio: 3.2 RATIO
Triglycerides: 196 mg/dL — ABNORMAL HIGH (ref <150)
VLDL: 39 mg/dL (ref 0–40)

## 2021-09-06 LAB — BASIC METABOLIC PANEL
Anion gap: 9 (ref 5–15)
BUN: 14 mg/dL (ref 6–20)
CO2: 24 mmol/L (ref 22–32)
Calcium: 9.4 mg/dL (ref 8.9–10.3)
Chloride: 105 mmol/L (ref 98–111)
Creatinine, Ser: 0.77 mg/dL (ref 0.61–1.24)
GFR, Estimated: >60 mL/min (ref >60)
Glucose, Bld: 150 mg/dL — ABNORMAL HIGH (ref 70–99)
Potassium: 4.2 mmol/L (ref 3.5–5.1)
Sodium: 138 mmol/L (ref 135–145)

## 2021-09-06 MED ORDER — FUROSEMIDE 20 MG PO TABS: 20.0000 mg | ORAL_TABLET | ORAL | 3 refills | Status: DC

## 2021-09-06 NOTE — Progress Notes (Signed)
PCP: Dr Uvaldo Rising Primary Cardiologist: Dr Shirlee Latch   HPI: Curtis [name] is a 45 y.o. male with a history of HTN, asthma, HL, DM, and chronic systolic HF.  He was admitted in 3/19 with dyspnea and volume overload and diagnosed with systolic CHF and cardiogenic shock.  Coronary angiography showed extensive but not critical CAD, did not explain extent of cardiomyopathy.  Cardiac MRI showed EF 21% with patchy mid-wall LGE pattern concerning for myocarditis. He was on short term milrinone but able to wean off. Had SVT/PVCs on milrinone but resolved once milrinone was weaned off. Discharge weight 219 pounds.  He was also noted to have a bicuspid aortic valve with moderate aortic insufficiency.   Echo in 6/19 showed EF up to 35-40%.  Echo in 12/19 showed EF 45% with bicuspid aortic valve and moderate AI. Echo in 8/21 with EF 45-50%, mild LV dilation, normal RV, bicuspid aortic valve with moderate aortic insufficiency with 4.2 cm ascending aorta.   He returns today for followup of CHF.  Weight down 15 lbs.  No exertional dyspnea, no chest pain.  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  No orthopnea/PND.  No lightheadedness or palpitations.    ECG (personally reviewed): NSR, IVCD 122 msec  Labs (5/19): K 4.4, creatinine 0.65  Labs (6/19): K 3.8, creatinine 0.69, LDL 16, TGs 300, HDL 33 Labs (8/19): K 3.8, creatinine 0.68, digoxin 0.7, LDL 28, HDL 30 Labs (12/19): K 4.2, creatinine 0.91 Labs (8/21): LDL 26, TGs 181 Labs (11/21): K 3.9, creatinine 0.84  PMH: 1. 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  - Echo (3/19): EF 15-20% with moderate LV dilation, mildly dilated RV with moderately decreased RV systolic function. The patient also has a bicuspid aortic valve with moderate AI.  The ascending aorta is dilated to 3.9 cm.  - Cardiac MRI (3/19): Severely dilated LV with LVEF 21%, mildly dilated RV with mildly decreased systolic function. Patchy mid-wall LGE:  possible viral myocarditis. - LHC/RHC (3/19): Extensive moderate but not critical CAD.  I would not expect this to have caused his EF to fall so markedly. On RHC, filling pressures were elevated with markedly low cardiac output and elevated SVR.  PAPi was low at 1.4 and CVP/PCWP high at 0.62 suggesting significant co-existing RV dysfunction.  - Echo (6/19): EF 35-40%, moderate diastolic dysfunction, moderate AI, bicuspid aortic valve, mildly decreased RV systolic function.  - Echo (12/19): EF 45% with mild LV dilation, mild LVH, moderate diastolic dysfunction, bicuspid aortic valve with moderate AI.  - Echo (8/21): EF 45-50%, mild LV dilation, normal RV, bicuspid aortic valve with moderate aortic insufficiency with 4.2 cm ascending aorta.  2. Bicuspid aortic valve: Echo in 8/21 showed moderate aortic insufficiency.  3. CAD: LHC (3/19) with 50% pLAD, 50% ostial ramus, 85% mid LCx, totally occluded OM2 with collaterals, 60% mRCA, 50% dRCA.  4. Type II diabetes  Review of systems complete and found to be negative unless listed in HPI.   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   Sexual activity: Never    Partners: Female    Comment: last 2 years ago  Other Topics Concern   Not on file  Social History Narrative   Not on file   Social Determinants of Health   Financial Resource Strain: Not on file  Food Insecurity: Not on file  Transportation Needs: Not on file  Physical Activity: Not on file  Stress: Not on file  Social Connections: Not on file   Intimate Partner Violence: Not on file    FH:  Family History  Problem Relation Age of Onset   Mitral valve prolapse Mother    Supraventricular tachycardia Mother    CAD Father 61       CABG, stent   Hypertension Father    Heart failure Maternal Grandfather    Congestive Heart Failure Paternal Uncle        has pacer, defibrillator   CAD Paternal Grandfather    CAD Maternal Grandmother    CAD Maternal Uncle     Current Outpatient Medications  Medication Sig Dispense Refill   albuterol (PROVENTIL HFA;VENTOLIN HFA) 108 (90 Base) MCG/ACT inhaler Inhale 2 puffs into the lungs every 6 (six) hours as needed for wheezing or shortness of breath.     aspirin 81 MG chewable tablet Chew 1 tablet (81 mg total) by mouth daily. 30 tablet 6   atorvastatin (LIPITOR) 80 MG tablet Take 1 tablet (80 mg total) by mouth daily at 6 PM. 30 tablet 6   carvedilol (COREG) 12.5 MG tablet TAKE 1 TABLET BY MOUTH TWICE DAILY WITH A MEAL 60 tablet 0   Cholecalciferol (VITAMIN D3) 5000 units CAPS Take 5,000 Units by mouth daily.     Coenzyme Q10 (CO Q 10 PO) Take 50 mg by mouth daily in the afternoon.     empagliflozin (JARDIANCE) 10 MG TABS tablet Take 1 tablet (10 mg total) by mouth daily before breakfast. 30 tablet 6   fluticasone (FLONASE) 50 MCG/ACT nasal spray Place 1 spray into both nostrils daily.     metFORMIN (GLUCOPHAGE-XR) 500 MG 24 hr tablet Take 1,000 mg by mouth 2 (two) times daily.     Multiple Vitamin (MULTIVITAMIN) tablet Take 1 tablet by mouth daily.     omega-3 acid ethyl esters (LOVAZA) 1 g capsule Take 1 g by mouth 2 (two) times daily.     sacubitril-valsartan (ENTRESTO) 97-103 MG Take 1 tablet by mouth 2 (two) times daily. Needs appt for further refills 180 tablet 0   sertraline (ZOLOFT) 100 MG tablet Take 100 mg by mouth daily.     spironolactone (ALDACTONE) 25 MG tablet TAKE 1 TABLET BY MOUTH ONCE DAILY . APPOINTMENT REQUIRED FOR FUTURE REFILLS 30 tablet 0   vitamin k 100 MCG tablet Take  100 mcg by mouth daily.     furosemide (LASIX) 20 MG tablet Take 1 tablet (20 mg total) by mouth every other day. 45 tablet 3   No current facility-administered medications for this encounter.    Vitals:   09/06/21 1029  BP: (!) 100/50  Pulse: 60  SpO2: 98%  Weight: 98.9 kg (218 lb)   Filed Weights   09/06/21 1029  Weight: 98.9 kg (218 lb)   Wt Readings from Last 3 Encounters:  09/06/21 98.9 kg (218 lb)  06/18/20 105.8 kg (233 lb 3.2 oz)  03/01/19 95.3 kg (210 lb)    PHYSICAL EXAM: General: NAD Neck: No JVD, no  thyromegaly or thyroid nodule.  Lungs: Clear to auscultation bilaterally with normal respiratory effort. CV: Nondisplaced PMI.  Heart regular S1/S2, no S3/S4, 1/6 SEM RUSB.  No peripheral edema.  No carotid bruit.  Normal pedal pulses.  Abdomen: Soft, nontender, no hepatosplenomegaly, no distention.  Skin: Intact without lesions or rashes.  Neurologic: Alert and oriented x 3.  Psych: Normal affect. Extremities: No clubbing or cyanosis.  HEENT: Normal.   ASSESSMENT & PLAN:  1. Chronic systolic CHF: Primarily nonischemic cardiomyopathy, possibly viral myocarditis based on cardiac MRI.  He also has extensive but not critical CAD, cardiomyopathy is well out of proportion to CAD.  Echo in 6/19 showed EF up to 35-40% and echo in 12/19 showed EF 45%. Echo in 8/21 with stable EF 45-50%. NYHA class II symptoms, he is not volume overloaded.  - Last echo was out of range for ICD.  - Repeat echo to reassess LV function.  - Continue Coreg 12.5 mg bid, no BP room today to increase.  - Continue Entresto 97/103 bid and spironolactone 25 mg daily. BMET today.  - Continue dapagliflozin 10 mg daily.  - Decrease Lasix to 20 mg every other day then stop if he remains stable.   2. CAD: LHC 01/2018 showed significant but not critical 3-vessel coronary artery disease. No chest pain.  - Continue ASA 81 daily.  - Continue statin, check lipids today.   3. Bicuspid Aortic Valve Disorder:  Echo in 8/21 with moderate AI and bicuspid aortic valve.  Ascending aorta mildly dilated 4.2 cm. - I will arrange for repeat echo.  - I will arrange for MRA chest for full assessment of thoracic aorta.  4. DM: Continue dapagliflozin.   Followup in 6 months if EF is stable on echo.   Marca Ancona, MD 09/06/2021

## 2021-09-06 NOTE — Patient Instructions (Addendum)
DECREASE Lasix to 20mg  every other day. Can stop if no shortness of breath or swelling.  Labs today and repeat in 3 months We will only contact you if something comes back abnormal or we need to make some changes. Otherwise no news is good news!  Your physician has requested that you have an echocardiogram. Echocardiography is a painless test that uses sound waves to create images of your heart. It provides your doctor with information about the size and shape of your heart and how well your heart's chambers and valves are working. This procedure takes approximately one hour. There are no restrictions for this procedure.  You have been ordered for an MR Angiogram of your chest. You will get a call to schedule this appointment.   Your physician recommends that you schedule a follow-up appointment in: 6 months. You will get a call closer to that time to schedule your appointment.   Please call office at [phone] option 2 if you have any questions or concerns.   At the [facility], you and your health needs are our priority. As part of our continuing mission to provide you with exceptional heart care, we have created designated Provider Care Teams. These Care Teams include your primary Cardiologist (physician) and Advanced Practice Providers (APPs- Physician Assistants and Nurse Practitioners) who all work together to provide you with the care you need, when you need it.   You may see any of the following providers on your designated Care Team at your next follow up: Dr [phone] Dr Arvilla Meres, NP Carron Curie, Robbie Lis [facility] [place], [place], PharmD   Please be sure to bring in all your medications bottles to every appointment.

## 2021-09-12 ENCOUNTER — Other Ambulatory Visit (HOSPITAL_COMMUNITY): Payer: Self-pay | Admitting: Cardiology

## 2021-09-24 ENCOUNTER — Other Ambulatory Visit (HOSPITAL_COMMUNITY): Payer: Self-pay | Admitting: Cardiology

## 2021-10-08 ENCOUNTER — Ambulatory Visit (HOSPITAL_COMMUNITY)
Admission: RE | Admit: 2021-10-08 | Discharge: 2021-10-08 | Disposition: A | Discharge status: Home / Self Care | Payer: 59 | Source: Ambulatory Visit | Attending: Family Medicine | Admitting: Family Medicine

## 2021-10-08 DIAGNOSIS — I5042 Chronic combined systolic (congestive) and diastolic (congestive) heart failure: Secondary | ICD-10-CM | POA: Insufficient documentation

## 2021-10-08 LAB — ECHOCARDIOGRAM COMPLETE
Area-P 1/2: 3.42 cm2
Calc EF: 44.3 %
S' Lateral: 4.8 cm
Single Plane A2C EF: 39 %
Single Plane A4C EF: 48.1 %

## 2021-10-21 ENCOUNTER — Other Ambulatory Visit (HOSPITAL_COMMUNITY): Payer: Self-pay | Admitting: Cardiology

## 2021-10-22 ENCOUNTER — Telehealth (HOSPITAL_COMMUNITY): Payer: Self-pay | Admitting: *Deleted

## 2021-10-22 NOTE — Telephone Encounter (Signed)
Request for MRA auth faxed to [employer].

## 2021-10-28 ENCOUNTER — Telehealth (HOSPITAL_COMMUNITY): Payer: Self-pay | Admitting: *Deleted

## 2021-10-28 NOTE — Telephone Encounter (Signed)
Received fax from [employer] to submit pre cert through [employer]. Request faxed to [employer].

## 2021-12-10 ENCOUNTER — Other Ambulatory Visit (HOSPITAL_COMMUNITY): Payer: No Typology Code available for payment source

## 2022-01-07 ENCOUNTER — Ambulatory Visit (HOSPITAL_COMMUNITY)
Admission: RE | Admit: 2022-01-07 | Discharge: 2022-01-07 | Disposition: A | Discharge status: Home / Self Care | Payer: Commercial Managed Care - HMO | Source: Ambulatory Visit | Attending: Internal Medicine | Admitting: Internal Medicine

## 2022-01-07 ENCOUNTER — Other Ambulatory Visit: Payer: Self-pay

## 2022-01-07 DIAGNOSIS — I5042 Chronic combined systolic (congestive) and diastolic (congestive) heart failure: Secondary | ICD-10-CM | POA: Diagnosis present

## 2022-01-07 LAB — BASIC METABOLIC PANEL
Anion gap: 8 (ref 5–15)
BUN: 14 mg/dL (ref 6–20)
CO2: 26 mmol/L (ref 22–32)
Calcium: 9.4 mg/dL (ref 8.9–10.3)
Chloride: 103 mmol/L (ref 98–111)
Creatinine, Ser: 0.8 mg/dL (ref 0.61–1.24)
GFR, Estimated: >60 mL/min (ref >60)
Glucose, Bld: 177 mg/dL — ABNORMAL HIGH (ref 70–99)
Potassium: 4.2 mmol/L (ref 3.5–5.1)
Sodium: 137 mmol/L (ref 135–145)

## 2022-01-22 ENCOUNTER — Telehealth (HOSPITAL_COMMUNITY): Payer: Self-pay | Admitting: *Deleted

## 2022-01-22 NOTE — Telephone Encounter (Signed)
Auth request for MRA faxed to [place]. [employer] responded and asked me to submit via [employer]. Request faxed to [employer] at [phone]  ?

## 2022-01-23 ENCOUNTER — Other Ambulatory Visit (HOSPITAL_COMMUNITY): Payer: Self-pay | Admitting: *Deleted

## 2022-01-23 ENCOUNTER — Telehealth (HOSPITAL_COMMUNITY): Payer: Self-pay | Admitting: Pharmacy Technician

## 2022-01-23 ENCOUNTER — Other Ambulatory Visit (HOSPITAL_COMMUNITY): Payer: Self-pay

## 2022-01-23 MED ORDER — DAPAGLIFLOZIN PROPANEDIOL 10 MG PO TABS: 10.0000 mg | ORAL_TABLET | Freq: Every day | ORAL | 3 refills | Status: DC

## 2022-01-23 NOTE — Telephone Encounter (Signed)
[facility] Patient Advocate Encounter ? ?Patient requested assistance with Jardiance copay. Significantly higher this year than last (30 day copay $120 after copay card). Upon further review, his insurance prefers [place], 90 day copay will be $0-$10 with the copay card. Patient agreeable to switching medications. Sent him a link for [place] copay card. Sent 90 day RX request to [place] ([facility]) to send to [place].  ? ?Curtis [name], CPhT ? ?

## 2022-02-14 ENCOUNTER — Telehealth: Payer: Self-pay | Admitting: Genetic Counselor

## 2022-02-14 NOTE — Telephone Encounter (Signed)
Scheduled appt per 3/20 referral. Pt is aware of appt date and time. Pt is aware to arrive 15 mins prior to appt time and to bring and updated insurance card. Pt is aware of appt location.   ?

## 2022-04-01 ENCOUNTER — Other Ambulatory Visit: Payer: Self-pay

## 2022-04-01 ENCOUNTER — Inpatient Hospital Stay: Payer: Commercial Managed Care - HMO | Attending: Internal Medicine | Admitting: Genetic Counselor

## 2022-04-01 ENCOUNTER — Inpatient Hospital Stay: Payer: Commercial Managed Care - HMO

## 2022-04-01 ENCOUNTER — Encounter: Payer: Self-pay | Admitting: Genetic Counselor

## 2022-04-01 DIAGNOSIS — Z8481 Family history of carrier of genetic disease: Secondary | ICD-10-CM

## 2022-04-01 DIAGNOSIS — Z8042 Family history of malignant neoplasm of prostate: Secondary | ICD-10-CM

## 2022-04-01 NOTE — Progress Notes (Signed)
REFERRING PROVIDER: ?Curtis Caraway, MD ?[name] ?[name],  [name] [id number] ? ?PRIMARY PROVIDER:  ?Curtis Caraway, MD ? ?PRIMARY REASON FOR VISIT:  ?1. Family history of BRCA gene mutation   ? ?HISTORY OF PRESENT ILLNESS:   ?Curtis Curtis [name], a 45 y.o. male, was seen for a [place] cancer genetics consultation at the request of Curtis Curtis [name] due to a family history of a BRCA2 mutation.  Curtis Curtis [name] presents to clinic today to discuss the possibility of a hereditary predisposition to cancer, to discuss genetic testing, and to further clarify his future cancer risks, as well as potential cancer risks for family members.  ? ?Curtis Curtis [name] is a 46 y.o. male with no personal history of cancer.   ? ?CANCER HISTORY:  ?Oncology History  ? No history exists.  ? ?Past Medical History:  ?Diagnosis Date  ? Asthma   ? CAD (coronary artery disease) 02/04/2018  ? LHC 02/04/18: multivessel CAD, 50% prox LAD, 80-90% mid LCx at take off of CTO'ed OM branch, 60-70% distal RCA stenosis  ? Diabetes mellitus without complication ([place])   ? Hyperlipidemia   ? Hypertension   ? MRSA (methicillin resistant Staphylococcus aureus) 2014  ? nose leison MRSA positive  ? Systolic heart failure ([place]) 02/03/2018  ? EF 15-20% on echo 02/03/18  ? ? ?Past Surgical History:  ?Procedure Laterality Date  ? ABSCESS DRAINAGE    ? MRSA inside nares  ? APPENDECTOMY    ? RIGHT/LEFT HEART CATH AND CORONARY ANGIOGRAPHY N/A 02/04/2018  ? Procedure: RIGHT/LEFT HEART CATH AND CORONARY ANGIOGRAPHY;  Surgeon: Nelva Bush, MD;  Location: [facility];  Service: Cardiovascular;  Laterality: N/A;  ? ? ?Social History  ? ?Socioeconomic History  ? Marital status: Single  ?  Spouse name: Not on file  ? Number of children: Not on file  ? Years of education: Not on file  ? Highest education level: Not on file  ?Occupational History  ? Occupation: Dealer  ?Tobacco Use  ? Smoking status: Never  ? Smokeless tobacco: Never  ?Substance and Sexual Activity  ? Alcohol  use: No  ?  Comment: remote occasional use  ? Drug use: No  ?  Comment: remote use of "weed edibles", more heavy abuse in college  ? Sexual activity: Never  ?  Partners: Female  ?  Comment: last 2 years ago  ?Other Topics Concern  ? Not on file  ?Social History Narrative  ? Not on file  ? ?Social Determinants of Health  ? ?Financial Resource Strain: Not on file  ?Food Insecurity: Not on file  ?Transportation Needs: Not on file  ?Physical Activity: Not on file  ?Stress: Not on file  ?Social Connections: Not on file  ?  ? ?FAMILY HISTORY:  ?We obtained a detailed, 4-generation family history.  Significant diagnoses are listed below: ?Family History  ?Problem Relation Age of Onset  ? Breast cancer Curtis [name] 75  ?     BRCA2+  ? Prostate cancer Curtis [name] 103  ? Breast cancer Maternal Aunt 36  ? Breast cancer Maternal Aunt 35  ? Colon cancer Maternal Uncle   ? Stomach cancer Maternal Uncle   ? Stomach cancer Maternal Uncle   ? Esophageal cancer Maternal Uncle   ? Breast cancer Paternal Aunt   ? ? ? ?Curtis Curtis [name] was diagnosed with breast cancer at age 27, she had a mastectomy and a BRCA2 mutation was identified on genetic testing [phone]). His maternal aunt was diagnosed with breast  cancer at age 84 and died at age 64. A second maternal aunt was diagnosed with breast cancer at age 67, she died at age 22. He has a maternal uncle diagnosed with colon cancer and stomach cancer, he is deceased. He has a second maternal uncle who was diagnosed with esophageal cancer and stomach cancer, he is deceased. His maternal cousin was diagnosed with breast cancer and found to carry the familial BRCA2 gene mutation. There is no known family history of pancreatic cancer.  ? ?Curtis Curtis [name] was diagnosed with prostate cancer at age 29, he had a prostatectomy. His Curtis [name] also has a history of squamous cell carcinoma. His paternal aunt was diagnosed with breast cancer in her 23s, she is deceased. He has a paternal great aunt who was  diagnosed with breast cancer at an unknown age. This great aunt's daughter (Curtis Curtis [name]) was diagnosed with pancreatic cancer at an unknown age, she died shortly after her diagnosis.  ? ?GENETIC COUNSELING ASSESSMENT: Curtis Curtis [name] is a 46 y.o. male with a family history of a BRCA2 gene mutation. We, therefore, discussed and recommended the following at today's visit.  ? ?The cancers associated with BRCA2 are: ?Male breast cancer, up to an 84% risk ?Male breast cancer, up to an 8% risk ?Ovarian cancer, 17-27% risk ?Pancreatic cancer, up to a 7% risk ?Prostate cancer, up to a 34% risk ?Melanoma, elevated risk ?  ?Management Recommendations: ? ?Breast Screening/Risk Reduction: ? ?Males: ?Breast self-exam training and education starting at age 71 years ?Annual clinical breast exam starting at age 25 years  ?Consider annual mammogram starting at age 24 or 60 years before the earliest known male breast cancer in the family (whichever comes first). ?  ?Skin Cancer Screening and Risk Reduction: ?Regular skin self-examinations ?Individuals should notify their physicians of any changes to moles such as increasing in size, darkening in color, or other change in appearance. ?Annual skin examinations by a dermatologist  ?Follow sun-safety recommendations such as: ?Using UVA and UVB 30 SPF or higher sunscreen ?Avoiding sunburns ?Limiting sun exposure, especially during the hours of 11am-4pm  ?Wearing protective clothing and sunglasses ?Avoid using tanning beds ?For more information about the prevention of melanoma visit [email or web address] ?  ?Prostate Cancer Screening: ?Annual digital rectal exam (DRE) at age 65 ?Annual PSA blood test at age 40 ? ?Pancreatic Cancer Screening/Risk Reduction: ?Avoid smoking, heavy alcohol use, and obesity. ?It has been suggested that pancreatic cancer screening be limited to those with a family history of pancreatic cancer (first- or second-degree relative). Ideally,  screening should be performed in experienced centers utilizing a multidisciplinary approach under research conditions. Recommended screening could include annual endoscopic ultrasound (preferred) and/or MRI of the pancreas starting at age 7 or 2 years younger than the earliest age diagnosis in the family. ?CA19-9 testing may be considered based on the physician's discretion. ? ?Due to Curtis Curtis [name] paternal family history of cancer, Curtis Curtis [name] was offered BRCA2 single site testing (not recommended), a common hereditary cancer panel (36 genes), and an expanded pan-cancer panel (77 genes). Curtis Curtis [name] was informed of the benefits and limitations of each panel, including that expanded pan-cancer panels contain several genes that do not have clear management guidelines at this point in time.  We also discussed that as the number of genes included on a panel increases, the chances of variants of uncertain significance increases.  After considering the benefits and limitations of each gene panel, Curtis Curtis [name] elected to have [facility] CancerNext  Panel.  ? ?Based on Curtis Curtis [name] family history of cancer, he meets medical criteria for genetic testing. Despite that he meets criteria, he may still have an out of pocket cost. We discussed that if his out of pocket cost for testing is over $100, the laboratory will call and confirm whether he wants to proceed with testing.  If the out of pocket cost of testing is less than $100 he will be billed by the genetic testing laboratory.  ? ?We discussed that some people do not want to undergo genetic testing due to fear of genetic discrimination.  A federal law called the Genetic Information Non-Discrimination Act (GINA) of 2008 helps protect individuals against genetic discrimination based on their genetic test results.  It impacts both health insurance and employment.  With health insurance, it protects against increased premiums, being kicked off insurance or being forced to take a  test in order to be insured.  For employment it protects against hiring, firing and promoting decisions based on genetic test results.  GINA does not apply to those in the [employer], those who work for

## 2022-04-24 ENCOUNTER — Telehealth: Payer: Self-pay | Admitting: Genetic Counselor

## 2022-04-24 ENCOUNTER — Encounter: Payer: Self-pay | Admitting: Genetic Counselor

## 2022-04-24 DIAGNOSIS — Z1379 Encounter for other screening for genetic and chromosomal anomalies: Secondary | ICD-10-CM | POA: Insufficient documentation

## 2022-04-24 NOTE — Telephone Encounter (Signed)
I contacted Mr. Balis to discuss his genetic testing results. No pathogenic variants were identified in the 36 genes analyzed. He did NOT inherit the familial BRCA2 gene mutation. Detailed clinic note to follow.  The test report has been scanned into EPIC and is located under the Molecular Pathology section of the Results Review tab.  A portion of the result report is included below for reference.   Lucille Passy, MS, [facility] Genetic Counselor [email or web address] (P) [phone]

## 2022-04-25 ENCOUNTER — Ambulatory Visit: Payer: Self-pay | Admitting: Genetic Counselor

## 2022-04-25 DIAGNOSIS — Z1379 Encounter for other screening for genetic and chromosomal anomalies: Secondary | ICD-10-CM

## 2022-04-25 NOTE — Progress Notes (Signed)
HPI:   Mr. Dikes was previously seen in the [facility] clinic due to a family history of a BRCA2 gene mutation. Please refer to our prior cancer genetics clinic note for more information regarding our discussion, assessment and recommendations, at the time. Mr. Yellin recent genetic test results were disclosed to him, as were recommendations warranted by these results. These results and recommendations are discussed in more detail below.  CANCER HISTORY:  Oncology History   No history exists.    FAMILY HISTORY:  We obtained a detailed, 4-generation family history.  Significant diagnoses are listed below:      Family History  Problem Relation Age of Onset   Breast cancer Mother 38        BRCA2+   Prostate cancer Father 20   Breast cancer Maternal Aunt 45   Breast cancer Maternal Aunt 35   Colon cancer Maternal Uncle     Stomach cancer Maternal Uncle     Stomach cancer Maternal Uncle     Esophageal cancer Maternal Uncle     Breast cancer Paternal Aunt         Mr. Rands mother was diagnosed with breast cancer at age 38, she had a mastectomy and a BRCA2 mutation was identified on genetic testing [phone]). His maternal aunt was diagnosed with breast cancer at age 54 and died at age 47. A second maternal aunt was diagnosed with breast cancer at age 28, she died at age 19. He has a maternal uncle diagnosed with colon cancer and stomach cancer, he is deceased. He has a second maternal uncle who was diagnosed with esophageal cancer and stomach cancer, he is deceased. His maternal cousin was diagnosed with breast cancer and found to carry the familial BRCA2 gene mutation. There is no known family history of pancreatic cancer.    Mr. Burkemper father was diagnosed with prostate cancer at age 26, he had a prostatectomy. His father also has a history of squamous cell carcinoma. His paternal aunt was diagnosed with breast cancer in her 17s, she is deceased. He has a paternal  great aunt who was diagnosed with breast cancer at an unknown age. This great aunt's daughter (Mr. Pheasant first cousin once removed) was diagnosed with pancreatic cancer at an unknown age, she died shortly after her diagnosis.   GENETIC TEST RESULTS:  The [facility] Panel found no pathogenic mutations. He did NOT inherit the familial BRCA2 gene mutation.  The CancerNext gene panel offered by [facility] includes sequencing, rearrangement analysis, and RNA analysis for the following 36 genes:   APC, ATM, AXIN2, BARD1, BMPR1A, BRCA1, BRCA2, BRIP1, CDH1, CDK4, CDKN2A, CHEK2, DICER1, HOXB13, EPCAM, GREM1, MLH1, MSH2, MSH3, MSH6, MUTYH, NBN, NF1, NTHL1, PALB2, PMS2, POLD1, POLE, PTEN, RAD51C, RAD51D, RECQL, SMAD4, SMARCA4, STK11, and TP53.    The test report has been scanned into EPIC and is located under the Molecular Pathology section of the Results Review tab.  A portion of the result report is included below for reference. Genetic testing reported out on 04/24/2022.       ADDITIONAL INFORMATION:  We recommended Mr. Jaso pursue testing for the familial BRCA2 gene mutation. Mr. Leinweber test was normal and did not reveal the familial mutation. We call this result a true negative result because the cancer-causing mutation was identified in Mr. Langenbach family, and he did not inherit it.  Given this negative result, Mr. Englert chances of developing BRCA2-related cancers are the same as they are in the general  population.   Everyone is at some risk to develop cancer. Therefore, we recommend he continue to follow the cancer management and screening guidelines provided by his primary healthcare provider.  Our contact number was provided. Mr. Landstrom questions were answered to his satisfaction, and he knows he is welcome to call us at anytime with additional questions or concerns.   Lucille Passy, MS, [facility] Genetic Counselor [email or web address] (P) [phone]

## 2022-04-28 ENCOUNTER — Encounter: Payer: Self-pay | Admitting: Genetic Counselor

## 2022-05-07 ENCOUNTER — Other Ambulatory Visit (HOSPITAL_COMMUNITY): Payer: Self-pay

## 2022-05-07 MED ORDER — FUROSEMIDE 20 MG PO TABS: 20.0000 mg | ORAL_TABLET | ORAL | 3 refills | Status: DC

## 2022-05-08 ENCOUNTER — Other Ambulatory Visit (HOSPITAL_COMMUNITY): Payer: Self-pay

## 2022-05-08 MED ORDER — SPIRONOLACTONE 25 MG PO TABS: 25.0000 mg | ORAL_TABLET | Freq: Every day | ORAL | 0 refills | Status: DC

## 2022-05-08 MED ORDER — CARVEDILOL 12.5 MG PO TABS: 12.5000 mg | ORAL_TABLET | Freq: Two times a day (BID) | ORAL | 0 refills | Status: DC

## 2022-06-23 ENCOUNTER — Other Ambulatory Visit (HOSPITAL_COMMUNITY): Payer: Self-pay

## 2022-06-23 MED ORDER — CARVEDILOL 12.5 MG PO TABS: 12.5000 mg | ORAL_TABLET | Freq: Two times a day (BID) | ORAL | 2 refills | Status: DC

## 2022-06-23 MED ORDER — SPIRONOLACTONE 25 MG PO TABS: 25.0000 mg | ORAL_TABLET | Freq: Every day | ORAL | 2 refills | Status: DC

## 2022-08-19 ENCOUNTER — Ambulatory Visit (HOSPITAL_COMMUNITY)
Admission: RE | Admit: 2022-08-19 | Discharge: 2022-08-19 | Disposition: A | Discharge status: Home / Self Care | Payer: Commercial Managed Care - HMO | Source: Ambulatory Visit | Attending: Cardiology | Admitting: Cardiology

## 2022-08-19 ENCOUNTER — Encounter (HOSPITAL_COMMUNITY): Payer: Self-pay | Admitting: Cardiology

## 2022-08-19 VITALS — BP 100/60 | HR 63 | Wt 225.6 lb

## 2022-08-19 DIAGNOSIS — I110 Hypertensive heart disease with heart failure: Secondary | ICD-10-CM | POA: Insufficient documentation

## 2022-08-19 DIAGNOSIS — Z7982 Long term (current) use of aspirin: Secondary | ICD-10-CM | POA: Diagnosis not present

## 2022-08-19 DIAGNOSIS — I5042 Chronic combined systolic (congestive) and diastolic (congestive) heart failure: Secondary | ICD-10-CM | POA: Insufficient documentation

## 2022-08-19 DIAGNOSIS — E119 Type 2 diabetes mellitus without complications: Secondary | ICD-10-CM | POA: Insufficient documentation

## 2022-08-19 DIAGNOSIS — Z7984 Long term (current) use of oral hypoglycemic drugs: Secondary | ICD-10-CM | POA: Insufficient documentation

## 2022-08-19 DIAGNOSIS — E785 Hyperlipidemia, unspecified: Secondary | ICD-10-CM

## 2022-08-19 DIAGNOSIS — N529 Male erectile dysfunction, unspecified: Secondary | ICD-10-CM | POA: Insufficient documentation

## 2022-08-19 DIAGNOSIS — R5383 Other fatigue: Secondary | ICD-10-CM | POA: Insufficient documentation

## 2022-08-19 DIAGNOSIS — Z79899 Other long term (current) drug therapy: Secondary | ICD-10-CM | POA: Insufficient documentation

## 2022-08-19 DIAGNOSIS — I2510 Atherosclerotic heart disease of native coronary artery without angina pectoris: Secondary | ICD-10-CM | POA: Insufficient documentation

## 2022-08-19 DIAGNOSIS — I428 Other cardiomyopathies: Secondary | ICD-10-CM | POA: Diagnosis not present

## 2022-08-19 DIAGNOSIS — Q231 Congenital insufficiency of aortic valve: Secondary | ICD-10-CM | POA: Diagnosis not present

## 2022-08-19 DIAGNOSIS — Z7901 Long term (current) use of anticoagulants: Secondary | ICD-10-CM | POA: Insufficient documentation

## 2022-08-19 DIAGNOSIS — J45909 Unspecified asthma, uncomplicated: Secondary | ICD-10-CM | POA: Diagnosis not present

## 2022-08-19 LAB — BASIC METABOLIC PANEL
Anion gap: 8 (ref 5–15)
BUN: 16 mg/dL (ref 6–20)
CO2: 22 mmol/L (ref 22–32)
Calcium: 9.1 mg/dL (ref 8.9–10.3)
Chloride: 109 mmol/L (ref 98–111)
Creatinine, Ser: 0.82 mg/dL (ref 0.61–1.24)
GFR, Estimated: >60 mL/min (ref >60)
Glucose, Bld: 217 mg/dL — ABNORMAL HIGH (ref 70–99)
Potassium: 4.3 mmol/L (ref 3.5–5.1)
Sodium: 139 mmol/L (ref 135–145)

## 2022-08-19 LAB — BRAIN NATRIURETIC PEPTIDE: B Natriuretic Peptide: 58.1 pg/mL (ref 0.0–100.0)

## 2022-08-19 LAB — LIPID PANEL
Cholesterol: 123 mg/dL (ref 0–200)
HDL: 39 mg/dL — ABNORMAL LOW (ref >40)
LDL Cholesterol: 41 mg/dL (ref 0–99)
Total CHOL/HDL Ratio: 3.2 RATIO
Triglycerides: 217 mg/dL — ABNORMAL HIGH (ref <150)
VLDL: 43 mg/dL — ABNORMAL HIGH (ref 0–40)

## 2022-08-19 MED ORDER — SILDENAFIL CITRATE 50 MG PO TABS: 50.0000 mg | ORAL_TABLET | Freq: Every day | ORAL | 3 refills | Status: AC | PRN

## 2022-08-19 NOTE — Patient Instructions (Signed)
Take Viagra 50mg  prn   Labs done today, your results will be available in MyChart, we will contact you for abnormal readings.  Repeat blood work in 3 months  Your physician has requested that you have an echocardiogram. Echocardiography is a painless test that uses sound waves to create images of your heart. It provides your doctor with information about the size and shape of your heart and how well your heart's chambers and valves are working. This procedure takes approximately one hour. There are no restrictions for this procedure.  Your physician recommends that you schedule a follow-up appointment in: 6 months (April 2024)  ** please call the office in February 2024 to arrange your follow up appointment **  If you have any questions or concerns before your next appointment please send [place] a message through [place] or call our office at [phone].    TO LEAVE A MESSAGE FOR THE NURSE SELECT OPTION 2, PLEASE LEAVE A MESSAGE INCLUDING: YOUR NAME DATE OF BIRTH CALL BACK NUMBER REASON FOR CALL**this is important as we prioritize the call backs  YOU WILL RECEIVE A CALL BACK THE SAME DAY AS LONG AS YOU CALL BEFORE 4:00 PM  At the [facility], you and your health needs are our priority. As part of our continuing mission to provide you with exceptional heart care, we have created designated Provider Care Teams. These Care Teams include your primary Cardiologist (physician) and Advanced Practice Providers (APPs- Physician Assistants and Nurse Practitioners) who all work together to provide you with the care you need, when you need it.   You may see any of the following providers on your designated Care Team at your next follow up: Dr Glori Bickers Dr Loralie Champagne Dr. Roxana Hires, NP Lyda Jester, [place] [facility] [place], [place] Forestine Na, NP Audry Riles, PharmD   Please be sure to bring in all your medications bottles to every  appointment.

## 2022-08-19 NOTE — Progress Notes (Signed)
PCP: Dr Leonides Schanz Primary Cardiologist: Dr Aundra Dubin   HPI: Curtis [name] is a 46 y.o. male with a history of HTN, asthma, HL, DM, and chronic systolic HF.  He was admitted in 3/19 with dyspnea and volume overload and diagnosed with systolic CHF and cardiogenic shock.  Coronary angiography showed extensive but not critical CAD, did not explain extent of cardiomyopathy.  Cardiac MRI showed EF 21% with patchy mid-wall LGE pattern concerning for myocarditis. He was on short term milrinone but able to wean off. Had SVT/PVCs on milrinone but resolved once milrinone was weaned off. Discharge weight 219 pounds.  He was also noted to have a bicuspid aortic valve with moderate aortic insufficiency.   Echo in 6/19 showed EF up to 35-40%.  Echo in 12/19 showed EF 45% with bicuspid aortic valve and moderate AI. Echo in 8/21 with EF 45-50%, mild LV dilation, normal RV, bicuspid aortic valve with moderate aortic insufficiency with 4.2 cm ascending aorta.  Echo in 11/22 showed EF 40-45%, RV normal, bicuspid aortic valve with moderate AI, 4.0 cm aortic root.   He returns today for followup of CHF.  Weight is up 7 lbs.  He has some generalized fatigue but denies significant exertional dyspnea.  He walks 1-3 miles/day for exercise. He continues to work at [employer].  No chest pain. No palpitations.    ECG (personally reviewed): NSR, IVCD 124 msec  Labs (5/19): K 4.4, creatinine 0.65  Labs (6/19): K 3.8, creatinine 0.69, LDL 16, TGs 300, HDL 33 Labs (8/19): K 3.8, creatinine 0.68, digoxin 0.7, LDL 28, HDL 30 Labs (12/19): K 4.2, creatinine 0.91 Labs (8/21): LDL 26, TGs 181 Labs (11/21): K 3.9, creatinine 0.84 Labs (10/22): LDL 45, TGs 196 Labs (2/23): K 4.2, creatinine 0.8  PMH: 1. 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  - Echo (3/19): EF 15-20% with moderate LV dilation, mildly dilated RV with moderately decreased RV systolic function. The patient also has a bicuspid  aortic valve with moderate AI.  The ascending aorta is dilated to 3.9 cm.  - Cardiac MRI (3/19): Severely dilated LV with LVEF 21%, mildly dilated RV with mildly decreased systolic function. Patchy mid-wall LGE: possible viral myocarditis. - LHC/RHC (3/19): Extensive moderate but not critical CAD.  I would not expect this to have caused his EF to fall so markedly. On RHC, filling pressures were elevated with markedly low cardiac output and elevated SVR.  PAPi was low at 1.4 and CVP/PCWP high at 0.62 suggesting significant co-existing RV dysfunction.  - Echo (6/19): EF 35-40%, moderate diastolic dysfunction, moderate AI, bicuspid aortic valve, mildly decreased RV systolic function.  - Echo (12/19): EF 45% with mild LV dilation, mild LVH, moderate diastolic dysfunction, bicuspid aortic valve with moderate AI.  - Echo (8/21): EF 45-50%, mild LV dilation, normal RV, bicuspid aortic valve with moderate aortic insufficiency with 4.2 cm ascending aorta.  - Echo (11/22): EF 40-45%, RV normal, bicuspid aortic valve with moderate AI, 4.0 cm aortic root.  2. Bicuspid aortic valve: Echo in 8/21 showed moderate aortic insufficiency.  3. CAD: LHC (3/19) with 50% pLAD, 50% ostial ramus, 85% mid LCx, totally occluded OM2 with collaterals, 60% mRCA, 50% dRCA.  4. Type II diabetes  Review of systems complete and found to be negative unless listed in HPI.   SH:  Social History   Socioeconomic History   Marital status: Single    Spouse name: Not on file   Number of children: Not on file  Years of education: Not on file   Highest education level: Not on file  Occupational History   Occupation: Dealer  Tobacco Use   Smoking status: Never   Smokeless tobacco: Never  Substance and Sexual Activity   Alcohol use: No    Comment: remote occasional use   Drug use: No    Comment: remote use of "weed edibles", more heavy abuse in college   Sexual activity: Never    Partners: Female    Comment: last 2 years ago   Other Topics Concern   Not on file  Social History Narrative   Not on file   Social Determinants of Health   Financial Resource Strain: Not on file  Food Insecurity: Not on file  Transportation Needs: Not on file  Physical Activity: Not on file  Stress: Not on file  Social Connections: Not on file  Intimate Partner Violence: Not on file    FH:  Family History  Problem Relation Age of Onset   Mitral valve prolapse Mother    Supraventricular tachycardia Mother    Breast cancer Mother 55       BRCA2+   CAD Father 53       CABG, stent   Hypertension Father    Prostate cancer Father 50   Breast cancer Maternal Aunt 45   Breast cancer Maternal Aunt 35   CAD Maternal Uncle    Colon cancer Maternal Uncle    Stomach cancer Maternal Uncle    Stomach cancer Maternal Uncle    Esophageal cancer Maternal Uncle    Breast cancer Paternal Aunt    Congestive Heart Failure Paternal Uncle        has pacer, defibrillator   CAD Maternal Grandmother    Heart failure Maternal Grandfather    CAD Paternal Grandfather     Current Outpatient Medications  Medication Sig Dispense Refill   albuterol (PROVENTIL HFA;VENTOLIN HFA) 108 (90 Base) MCG/ACT inhaler Inhale 2 puffs into the lungs every 6 (six) hours as needed for wheezing or shortness of breath.     aspirin 81 MG chewable tablet Chew 1 tablet (81 mg total) by mouth daily. 30 tablet 6   atorvastatin (LIPITOR) 80 MG tablet Take 1 tablet (80 mg total) by mouth daily at 6 PM. 30 tablet 6   carvedilol (COREG) 12.5 MG tablet Take 1 tablet (12.5 mg total) by mouth 2 (two) times daily with a meal. 60 tablet 2   Cholecalciferol (VITAMIN D3) 5000 units CAPS Take 5,000 Units by mouth daily.     Coenzyme Q10 (CO Q 10 PO) Take 50 mg by mouth daily in the afternoon.     dapagliflozin propanediol (FARXIGA) 10 MG TABS tablet Take 1 tablet (10 mg total) by mouth daily before breakfast. 90 tablet 3   ENTRESTO 97-103 MG TAKE 1 TABLET BY MOUTH TWICE DAILY .  APPOINTMENT REQUIRED FOR FUTURE REFILLS 180 tablet 3   fluticasone (FLONASE) 50 MCG/ACT nasal spray Place 1 spray into both nostrils daily.     furosemide (LASIX) 20 MG tablet Take 1 tablet (20 mg total) by mouth every other day. 45 tablet 3   metFORMIN (GLUCOPHAGE-XR) 500 MG 24 hr tablet Take 1,000 mg by mouth 2 (two) times daily.     Multiple Vitamin (MULTIVITAMIN) tablet Take 1 tablet by mouth daily.     omega-3 acid ethyl esters (LOVAZA) 1 g capsule Take 1 g by mouth 2 (two) times daily.     sertraline (ZOLOFT) 100 MG tablet Take  100 mg by mouth daily.     sildenafil (VIAGRA) 50 MG tablet Take 1 tablet (50 mg total) by mouth daily as needed for erectile dysfunction. 10 tablet 3   spironolactone (ALDACTONE) 25 MG tablet Take 1 tablet (25 mg total) by mouth daily. 30 tablet 2   vitamin k 100 MCG tablet Take 100 mcg by mouth daily.     No current facility-administered medications for this encounter.    Vitals:   08/19/22 0908  BP: 100/60  Pulse: 63  SpO2: 98%  Weight: 102.3 kg (225 lb 9.6 oz)   Filed Weights   08/19/22 0908  Weight: 102.3 kg (225 lb 9.6 oz)   Wt Readings from Last 3 Encounters:  08/19/22 102.3 kg (225 lb 9.6 oz)  09/06/21 98.9 kg (218 lb)  06/18/20 105.8 kg (233 lb 3.2 oz)    PHYSICAL EXAM: General: NAD Neck: No JVD, no thyromegaly or thyroid nodule.  Lungs: Clear to auscultation bilaterally with normal respiratory effort. CV: Nondisplaced PMI.  Heart regular S1/S2, no S3/S4, 1/6 SEM RUSB.  No peripheral edema.  No carotid bruit.  Normal pedal pulses.  Abdomen: Soft, nontender, no hepatosplenomegaly, no distention.  Skin: Intact without lesions or rashes.  Neurologic: Alert and oriented x 3.  Psych: Normal affect. Extremities: No clubbing or cyanosis.  HEENT: Normal.   ASSESSMENT & PLAN:  1. Chronic systolic CHF: Primarily nonischemic cardiomyopathy, possibly viral myocarditis based on cardiac MRI.  He also has extensive but not critical CAD,  cardiomyopathy is well out of proportion to CAD.  Echo in 6/19 showed EF up to 35-40% and echo in 12/19 showed EF 45%. Echo in 8/21 with stable EF 45-50%. Echo in 11/22 with EF 40-45%.  NYHA class I-II symptoms, he is not volume overloaded.  - I will arrange for repeat echo in 11/23.  Last echo was out of ICD range.  - Continue Coreg 12.5 mg bid, no BP room today to increase.  - Continue Entresto 97/103 bid and spironolactone 25 mg daily. BMET today.  - Continue dapagliflozin 10 mg daily.  - Continue Lasix 20 mg every other day.  2. CAD: LHC 01/2018 showed significant but not critical 3-vessel coronary artery disease. No chest pain.  - Continue ASA 81 daily.  - Continue statin, check lipids and Lp(a) today.   3. Bicuspid Aortic Valve Disorder: Echo in 8/21 with moderate AI and bicuspid aortic valve.  Echo in 11/22 with bicuspid aortic valve and moderate AI, 4 cm aortic root.  - I will arrange for repeat echo as above.  - I will arrange for MRA chest for full assessment of thoracic aorta.  4. DM: Continue dapagliflozin.  5. Erectile dysfunction: I will prescribe him Viagra 50 mg prn.  He does not use NTG and was warned about interaction.   Followup in 6 months if EF is stable on echo, will need BMET in 3 months.   Loralie Champagne, MD 08/19/2022

## 2022-08-20 LAB — LIPOPROTEIN A (LPA): Lipoprotein (a): 23.1 nmol/L (ref <75.0)

## 2022-08-22 ENCOUNTER — Telehealth (HOSPITAL_COMMUNITY): Payer: Self-pay

## 2022-08-22 DIAGNOSIS — E785 Hyperlipidemia, unspecified: Secondary | ICD-10-CM

## 2022-08-22 MED ORDER — ICOSAPENT ETHYL 1 G PO CAPS: 2.0000 g | ORAL_CAPSULE | Freq: Two times a day (BID) | ORAL | 2 refills | Status: DC

## 2022-08-22 NOTE — Telephone Encounter (Signed)
Patient advised and verbalized understanding. Rx sent into patients pharmacy. Lab appt scheduled, lab order entered.   Meds ordered this encounter  Medications   icosapent Ethyl (VASCEPA) 1 g capsule    Sig: Take 2 capsules (2 g total) by mouth 2 (two) times daily.    Dispense:  120 capsule    Refill:  2   Orders Placed This Encounter  Procedures   Lipid panel    Standing Status:   Future    Standing Expiration Date:   08/23/2023    Order Specific Question:   Release to patient    Answer:   Immediate

## 2022-08-22 NOTE — Telephone Encounter (Signed)
-----   Message from Larey Dresser, MD sent at 08/19/2022  4:22 PM EDT ----- Given coronary disease, would add Vascepa 2 g bid for elevated triglycerides with lipid check in 2 months.

## 2022-09-02 ENCOUNTER — Telehealth (HOSPITAL_COMMUNITY): Payer: Self-pay

## 2022-09-02 ENCOUNTER — Other Ambulatory Visit (HOSPITAL_COMMUNITY): Payer: Self-pay

## 2022-09-02 NOTE — Telephone Encounter (Signed)
Advanced Heart Failure Patient Advocate Encounter  Received notification from [employer] that prior authorization is required for Vascepa. PA submitted and APPROVED on 09/02/22.  Key [id number] Effective: 09/02/22 - 09/02/23  Clista Bernhardt, CPhT Rx Patient Advocate Phone: [phone]

## 2022-09-05 ENCOUNTER — Telehealth (HOSPITAL_COMMUNITY): Payer: Self-pay | Admitting: *Deleted

## 2022-09-05 NOTE — Telephone Encounter (Signed)
MRA auth request faxed to [employer] at [phone]

## 2022-09-22 ENCOUNTER — Other Ambulatory Visit (HOSPITAL_COMMUNITY): Payer: Self-pay | Admitting: Cardiology

## 2022-10-14 ENCOUNTER — Other Ambulatory Visit (HOSPITAL_COMMUNITY): Payer: Self-pay | Admitting: Cardiology

## 2022-10-21 ENCOUNTER — Ambulatory Visit (HOSPITAL_COMMUNITY)
Admission: RE | Admit: 2022-10-21 | Discharge: 2022-10-21 | Disposition: A | Discharge status: Home / Self Care | Payer: Commercial Managed Care - HMO | Source: Ambulatory Visit | Attending: Cardiology | Admitting: Cardiology

## 2022-10-21 DIAGNOSIS — E785 Hyperlipidemia, unspecified: Secondary | ICD-10-CM | POA: Insufficient documentation

## 2022-10-21 DIAGNOSIS — I5042 Chronic combined systolic (congestive) and diastolic (congestive) heart failure: Secondary | ICD-10-CM | POA: Diagnosis not present

## 2022-10-21 LAB — LIPID PANEL
Cholesterol: 111 mg/dL (ref 0–200)
HDL: 35 mg/dL — ABNORMAL LOW (ref >40)
LDL Cholesterol: 30 mg/dL (ref 0–99)
Total CHOL/HDL Ratio: 3.2 RATIO
Triglycerides: 231 mg/dL — ABNORMAL HIGH (ref <150)
VLDL: 46 mg/dL — ABNORMAL HIGH (ref 0–40)

## 2022-10-21 LAB — BASIC METABOLIC PANEL
Anion gap: 8 (ref 5–15)
BUN: 18 mg/dL (ref 6–20)
CO2: 24 mmol/L (ref 22–32)
Calcium: 9.3 mg/dL (ref 8.9–10.3)
Chloride: 107 mmol/L (ref 98–111)
Creatinine, Ser: 0.89 mg/dL (ref 0.61–1.24)
GFR, Estimated: >60 mL/min (ref >60)
Glucose, Bld: 189 mg/dL — ABNORMAL HIGH (ref 70–99)
Potassium: 4.2 mmol/L (ref 3.5–5.1)
Sodium: 139 mmol/L (ref 135–145)

## 2022-10-23 ENCOUNTER — Encounter (HOSPITAL_COMMUNITY): Payer: Self-pay

## 2022-10-28 ENCOUNTER — Other Ambulatory Visit (HOSPITAL_COMMUNITY): Payer: Self-pay | Admitting: Cardiology

## 2022-11-18 ENCOUNTER — Ambulatory Visit (HOSPITAL_COMMUNITY)
Admission: RE | Admit: 2022-11-18 | Discharge: 2022-11-18 | Disposition: A | Discharge status: Home / Self Care | Payer: Commercial Managed Care - HMO | Source: Ambulatory Visit | Attending: Cardiology | Admitting: Cardiology

## 2022-11-18 DIAGNOSIS — I5042 Chronic combined systolic (congestive) and diastolic (congestive) heart failure: Secondary | ICD-10-CM | POA: Insufficient documentation

## 2022-11-18 LAB — BASIC METABOLIC PANEL
Anion gap: 10 (ref 5–15)
BUN: 16 mg/dL (ref 6–20)
CO2: 25 mmol/L (ref 22–32)
Calcium: 9.1 mg/dL (ref 8.9–10.3)
Chloride: 103 mmol/L (ref 98–111)
Creatinine, Ser: 0.94 mg/dL (ref 0.61–1.24)
GFR, Estimated: >60 mL/min (ref >60)
Glucose, Bld: 233 mg/dL — ABNORMAL HIGH (ref 70–99)
Potassium: 4.2 mmol/L (ref 3.5–5.1)
Sodium: 138 mmol/L (ref 135–145)

## 2022-12-16 ENCOUNTER — Other Ambulatory Visit (HOSPITAL_COMMUNITY): Payer: Self-pay | Admitting: Cardiology

## 2022-12-19 ENCOUNTER — Other Ambulatory Visit (HOSPITAL_COMMUNITY): Payer: Self-pay | Admitting: Cardiology

## 2022-12-23 ENCOUNTER — Other Ambulatory Visit (HOSPITAL_COMMUNITY): Payer: Self-pay | Admitting: Cardiology

## 2022-12-23 MED ORDER — ICOSAPENT ETHYL 0.5 G PO CAPS: 2.0000 g | ORAL_CAPSULE | Freq: Two times a day (BID) | ORAL | 11 refills | Status: DC

## 2022-12-26 ENCOUNTER — Other Ambulatory Visit (HOSPITAL_COMMUNITY): Payer: Self-pay

## 2022-12-29 ENCOUNTER — Other Ambulatory Visit (HOSPITAL_COMMUNITY): Payer: Self-pay

## 2023-01-24 ENCOUNTER — Other Ambulatory Visit (HOSPITAL_COMMUNITY): Payer: Self-pay | Admitting: Cardiology

## 2023-03-14 ENCOUNTER — Other Ambulatory Visit (HOSPITAL_COMMUNITY): Payer: Self-pay | Admitting: Cardiology

## 2023-04-23 ENCOUNTER — Other Ambulatory Visit (HOSPITAL_COMMUNITY): Payer: Self-pay | Admitting: Cardiology

## 2023-04-24 ENCOUNTER — Other Ambulatory Visit (HOSPITAL_COMMUNITY): Payer: Self-pay | Admitting: Cardiology

## 2023-05-09 ENCOUNTER — Other Ambulatory Visit (HOSPITAL_COMMUNITY): Payer: Self-pay | Admitting: Cardiology

## 2023-06-08 ENCOUNTER — Telehealth (HOSPITAL_COMMUNITY): Payer: Self-pay

## 2023-06-08 NOTE — Telephone Encounter (Signed)
Surgical Clearance forms faxed to [facility] on 06/08/23 at [phone]

## 2023-06-17 ENCOUNTER — Other Ambulatory Visit (HOSPITAL_COMMUNITY): Payer: Self-pay | Admitting: Cardiology

## 2023-06-23 ENCOUNTER — Other Ambulatory Visit (HOSPITAL_COMMUNITY): Payer: Self-pay | Admitting: Cardiology

## 2023-07-13 NOTE — Progress Notes (Incomplete)
PCP: Dr Uvaldo Rising Primary Cardiologist: Dr Shirlee Latch   HPI: Curtis [name] is a 47 y.o. male with a history of HTN, asthma, HL, DM, and chronic systolic HF.  He was admitted in 3/19 with dyspnea and volume overload and diagnosed with systolic CHF and cardiogenic shock.  Coronary angiography showed extensive but not critical CAD, did not explain extent of cardiomyopathy.  Cardiac MRI showed EF 21% with patchy mid-wall LGE pattern concerning for myocarditis. He was on short term milrinone but able to wean off. Had SVT/PVCs on milrinone but resolved once milrinone was weaned off. Discharge weight 219 pounds.  He was also noted to have a bicuspid aortic valve with moderate aortic insufficiency.   Echo in 6/19 showed EF up to 35-40%.  Echo in 12/19 showed EF 45% with bicuspid aortic valve and moderate AI. Echo in 8/21 with EF 45-50%, mild LV dilation, normal RV, bicuspid aortic valve with moderate aortic insufficiency with 4.2 cm ascending aorta.  Echo in 11/22 showed EF 40-45%, RV normal, bicuspid aortic valve with moderate AI, 4.0 cm aortic root.   He returns today for followup of CHF.  Weight is up 7 lbs.  He has some generalized fatigue but denies significant exertional dyspnea.  He walks 1-3 miles/day for exercise. He continues to work at [employer].  No chest pain. No palpitations.    ECG (personally reviewed): NSR, IVCD 124 msec  Labs (5/19): K 4.4, creatinine 0.65  Labs (6/19): K 3.8, creatinine 0.69, LDL 16, TGs 300, HDL 33 Labs (8/19): K 3.8, creatinine 0.68, digoxin 0.7, LDL 28, HDL 30 Labs (12/19): K 4.2, creatinine 0.91 Labs (8/21): LDL 26, TGs 181 Labs (11/21): K 3.9, creatinine 0.84 Labs (10/22): LDL 45, TGs 196 Labs (2/23): K 4.2, creatinine 0.8  PMH: 1. 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  - Echo (3/19): EF 15-20% with moderate LV dilation, mildly dilated RV with moderately decreased RV systolic function. The patient also has a bicuspid  aortic valve with moderate AI.  The ascending aorta is dilated to 3.9 cm.  - Cardiac MRI (3/19): Severely dilated LV with LVEF 21%, mildly dilated RV with mildly decreased systolic function. Patchy mid-wall LGE: possible viral myocarditis. - LHC/RHC (3/19): Extensive moderate but not critical CAD.  I would not expect this to have caused his EF to fall so markedly. On RHC, filling pressures were elevated with markedly low cardiac output and elevated SVR.  PAPi was low at 1.4 and CVP/PCWP high at 0.62 suggesting significant co-existing RV dysfunction.  - Echo (6/19): EF 35-40%, moderate diastolic dysfunction, moderate AI, bicuspid aortic valve, mildly decreased RV systolic function.  - Echo (12/19): EF 45% with mild LV dilation, mild LVH, moderate diastolic dysfunction, bicuspid aortic valve with moderate AI.  - Echo (8/21): EF 45-50%, mild LV dilation, normal RV, bicuspid aortic valve with moderate aortic insufficiency with 4.2 cm ascending aorta.  - Echo (11/22): EF 40-45%, RV normal, bicuspid aortic valve with moderate AI, 4.0 cm aortic root.  2. Bicuspid aortic valve: Echo in 8/21 showed moderate aortic insufficiency.  3. CAD: LHC (3/19) with 50% pLAD, 50% ostial ramus, 85% mid LCx, totally occluded OM2 with collaterals, 60% mRCA, 50% dRCA.  4. Type II diabetes  Review of systems complete and found to be negative unless listed in HPI.   SH:  Social History   Socioeconomic History   Marital status: Single    Spouse name: Not on file   Number of children: Not on file  Years of education: Not on file   Highest education level: Not on file  Occupational History   Occupation: Curator  Tobacco Use   Smoking status: Never   Smokeless tobacco: Never  Substance and Sexual Activity   Alcohol use: No    Comment: remote occasional use   Drug use: No    Comment: remote use of "weed edibles", more heavy abuse in college   Sexual activity: Never    Partners: Female    Comment: last 2 years ago   Other Topics Concern   Not on file  Social History Narrative   Not on file   Social Determinants of Health   Financial Resource Strain: Not on file  Food Insecurity: Not on file  Transportation Needs: Not on file  Physical Activity: Not on file  Stress: Not on file  Social Connections: Not on file  Intimate Partner Violence: Not on file    FH:  Family History  Problem Relation Age of Onset   Mitral valve prolapse Mother    Supraventricular tachycardia Mother    Breast cancer Mother 16       BRCA2+   CAD Father 37       CABG, stent   Hypertension Father    Prostate cancer Father 50   Breast cancer Maternal Aunt 45   Breast cancer Maternal Aunt 35   CAD Maternal Uncle    Colon cancer Maternal Uncle    Stomach cancer Maternal Uncle    Stomach cancer Maternal Uncle    Esophageal cancer Maternal Uncle    Breast cancer Paternal Aunt    Congestive Heart Failure Paternal Uncle        has pacer, defibrillator   CAD Maternal Grandmother    Heart failure Maternal Grandfather    CAD Paternal Grandfather     Current Outpatient Medications  Medication Sig Dispense Refill   albuterol (PROVENTIL HFA;VENTOLIN HFA) 108 (90 Base) MCG/ACT inhaler Inhale 2 puffs into the lungs every 6 (six) hours as needed for wheezing or shortness of breath.     aspirin 81 MG chewable tablet Chew 1 tablet (81 mg total) by mouth daily. 30 tablet 6   atorvastatin (LIPITOR) 80 MG tablet Take 1 tablet (80 mg total) by mouth daily at 6 PM. 30 tablet 6   carvedilol (COREG) 12.5 MG tablet TAKE 1 TABLET BY MOUTH TWICE DAILY WITH A MEAL . APPOINTMENT REQUIRED FOR FUTURE REFILLS 60 tablet 0   Cholecalciferol (VITAMIN D3) 5000 units CAPS Take 5,000 Units by mouth daily.     Coenzyme Q10 (CO Q 10 PO) Take 50 mg by mouth daily in the afternoon.     ENTRESTO 97-103 MG TAKE 1 TABLET BY MOUTH TWICE DAILY . APPOINTMENT REQUIRED FOR FUTURE REFILLS 90 tablet 0   FARXIGA 10 MG TABS tablet Take 1 tablet (10 mg total) by  mouth daily. NEEDS FOLLOW UP APPOINTMENT FOR MORE REFILLS 90 tablet 0   fluticasone (FLONASE) 50 MCG/ACT nasal spray Place 1 spray into both nostrils daily.     furosemide (LASIX) 20 MG tablet Take 1 tablet (20 mg total) by mouth every other day. 45 tablet 3   Icosapent Ethyl 0.5 g CAPS Take 4 capsules (2 g total) by mouth 2 (two) times daily. 240 capsule 11   metFORMIN (GLUCOPHAGE-XR) 500 MG 24 hr tablet Take 1,000 mg by mouth 2 (two) times daily.     Multiple Vitamin (MULTIVITAMIN) tablet Take 1 tablet by mouth daily.     omega-3  acid ethyl esters (LOVAZA) 1 g capsule Take 1 g by mouth 2 (two) times daily.     sertraline (ZOLOFT) 100 MG tablet Take 100 mg by mouth daily.     sildenafil (VIAGRA) 50 MG tablet Take 1 tablet (50 mg total) by mouth daily as needed for erectile dysfunction. 10 tablet 3   spironolactone (ALDACTONE) 25 MG tablet TAKE 1 TABLET BY MOUTH ONCE DAILY . APPOINTMENT REQUIRED FOR FUTURE REFILLS 30 tablet 0   vitamin k 100 MCG tablet Take 100 mcg by mouth daily.     No current facility-administered medications for this visit.    There were no vitals filed for this visit.  There were no vitals filed for this visit.  Wt Readings from Last 3 Encounters:  08/19/22 102.3 kg (225 lb 9.6 oz)  09/06/21 98.9 kg (218 lb)  06/18/20 105.8 kg (233 lb 3.2 oz)    PHYSICAL EXAM: General: NAD Neck: No JVD, no thyromegaly or thyroid nodule.  Lungs: Clear to auscultation bilaterally with normal respiratory effort. CV: Nondisplaced PMI.  Heart regular S1/S2, no S3/S4, 1/6 SEM RUSB.  No peripheral edema.  No carotid bruit.  Normal pedal pulses.  Abdomen: Soft, nontender, no hepatosplenomegaly, no distention.  Skin: Intact without lesions or rashes.  Neurologic: Alert and oriented x 3.  Psych: Normal affect. Extremities: No clubbing or cyanosis.  HEENT: Normal.   ASSESSMENT & PLAN:  1. Chronic systolic CHF: Primarily nonischemic cardiomyopathy, possibly viral myocarditis based on  cardiac MRI.  He also has extensive but not critical CAD, cardiomyopathy is well out of proportion to CAD.  Echo in 6/19 showed EF up to 35-40% and echo in 12/19 showed EF 45%. Echo in 8/21 with stable EF 45-50%. Echo in 11/22 with EF 40-45%.  NYHA class I-II symptoms, he is not volume overloaded.  - I will arrange for repeat echo in 11/23.  Last echo was out of ICD range.  - Continue Coreg 12.5 mg bid, no BP room today to increase.  - Continue Entresto 97/103 bid and spironolactone 25 mg daily. BMET today.  - Continue dapagliflozin 10 mg daily.  - Continue Lasix 20 mg every other day.  2. CAD: LHC 01/2018 showed significant but not critical 3-vessel coronary artery disease. No chest pain.  - Continue ASA 81 daily.  - Continue statin, check lipids and Lp(a) today.   3. Bicuspid Aortic Valve Disorder: Echo in 8/21 with moderate AI and bicuspid aortic valve.  Echo in 11/22 with bicuspid aortic valve and moderate AI, 4 cm aortic root.  - I will arrange for repeat echo as above.  - I will arrange for MRA chest for full assessment of thoracic aorta.  4. DM: Continue dapagliflozin.  5. Erectile dysfunction: I will prescribe him Viagra 50 mg prn.  He does not use NTG and was warned about interaction.   Followup in 6 months if EF is stable on echo, will need BMET in 3 months.   Anderson Malta [place], [place] 07/13/2023

## 2023-07-14 ENCOUNTER — Ambulatory Visit (HOSPITAL_COMMUNITY): Admission: RE | Admit: 2023-07-14 | Payer: Commercial Managed Care - HMO | Source: Ambulatory Visit

## 2023-07-14 ENCOUNTER — Encounter (HOSPITAL_COMMUNITY): Payer: Self-pay

## 2023-07-14 VITALS — BP 98/56 | HR 68 | Wt 222.6 lb

## 2023-07-14 DIAGNOSIS — K4020 Bilateral inguinal hernia, without obstruction or gangrene, not specified as recurrent: Secondary | ICD-10-CM | POA: Diagnosis not present

## 2023-07-14 DIAGNOSIS — Z7984 Long term (current) use of oral hypoglycemic drugs: Secondary | ICD-10-CM | POA: Insufficient documentation

## 2023-07-14 DIAGNOSIS — Q231 Congenital insufficiency of aortic valve: Secondary | ICD-10-CM

## 2023-07-14 DIAGNOSIS — R9431 Abnormal electrocardiogram [ECG] [EKG]: Secondary | ICD-10-CM | POA: Diagnosis not present

## 2023-07-14 DIAGNOSIS — I428 Other cardiomyopathies: Secondary | ICD-10-CM | POA: Diagnosis not present

## 2023-07-14 DIAGNOSIS — I5022 Chronic systolic (congestive) heart failure: Secondary | ICD-10-CM | POA: Diagnosis present

## 2023-07-14 DIAGNOSIS — R5383 Other fatigue: Secondary | ICD-10-CM

## 2023-07-14 DIAGNOSIS — I110 Hypertensive heart disease with heart failure: Secondary | ICD-10-CM | POA: Diagnosis present

## 2023-07-14 DIAGNOSIS — E119 Type 2 diabetes mellitus without complications: Secondary | ICD-10-CM

## 2023-07-14 DIAGNOSIS — I2510 Atherosclerotic heart disease of native coronary artery without angina pectoris: Secondary | ICD-10-CM | POA: Diagnosis not present

## 2023-07-14 LAB — LIPID PANEL
Cholesterol: 116 mg/dL (ref 0–200)
HDL: 40 mg/dL — ABNORMAL LOW (ref >40)
LDL Cholesterol: 42 mg/dL (ref 0–99)
Total CHOL/HDL Ratio: 2.9 RATIO
Triglycerides: 169 mg/dL — ABNORMAL HIGH (ref <150)
VLDL: 34 mg/dL (ref 0–40)

## 2023-07-14 LAB — IRON AND TIBC
Iron: 45 ug/dL (ref 45–182)
Saturation Ratios: 14 % — ABNORMAL LOW (ref 17.9–39.5)
TIBC: 333 ug/dL (ref 250–450)
UIBC: 288 ug/dL

## 2023-07-14 LAB — BASIC METABOLIC PANEL
Anion gap: 9 (ref 5–15)
BUN: 13 mg/dL (ref 6–20)
CO2: 24 mmol/L (ref 22–32)
Calcium: 8.7 mg/dL — ABNORMAL LOW (ref 8.9–10.3)
Chloride: 107 mmol/L (ref 98–111)
Creatinine, Ser: 0.96 mg/dL (ref 0.61–1.24)
GFR, Estimated: >60 mL/min (ref >60)
Glucose, Bld: 193 mg/dL — ABNORMAL HIGH (ref 70–99)
Potassium: 3.9 mmol/L (ref 3.5–5.1)
Sodium: 140 mmol/L (ref 135–145)

## 2023-07-14 LAB — CBC
HCT: 42.2 % (ref 39.0–52.0)
Hemoglobin: 14.9 g/dL (ref 13.0–17.0)
MCH: 31.7 pg (ref 26.0–34.0)
MCHC: 35.3 g/dL (ref 30.0–36.0)
MCV: 89.8 fL (ref 80.0–100.0)
Platelets: 242 10*3/uL (ref 150–400)
RBC: 4.7 MIL/uL (ref 4.22–5.81)
RDW: 12.2 % (ref 11.5–15.5)
WBC: 9.5 10*3/uL (ref 4.0–10.5)
nRBC: 0 % (ref 0.0–0.2)

## 2023-07-14 LAB — FERRITIN: Ferritin: 89 ng/mL (ref 24–336)

## 2023-07-14 NOTE — Patient Instructions (Signed)
Medication Changes:  No Changes In Medications at this time.   Lab Work:  Labs done today, your results will be available in MyChart, we will contact you for abnormal readings.  Testing/Procedures:  Your physician has requested that you have an echocardiogram. Echocardiography is a painless test that uses sound waves to create images of your heart. It provides your doctor with information about the size and shape of your heart and how well your heart's chambers and valves are working. This procedure takes approximately one hour. There are no restrictions for this procedure. Please do NOT wear cologne, perfume, aftershave, or lotions (deodorant is allowed). Please arrive 15 minutes prior to your appointment time.  MRA of the CHEST- SOMEONE WILL CALL YOU TO GET YOU SCHEDULED FOR THIS ONCE APPROVED BY INSURANCE   Follow-Up in: 6 MONTHS WITH DR. Shirlee Latch PLEASE CALL OUR OFFICE AROUND DECEMBER TO GET SCHEDULED FOR YOUR APPOINTMENT. PHONE NUMBER IS [phone] OPTION 2   At the [facility], you and your health needs are our priority. We have a designated team specialized in the treatment of Heart Failure. This Care Team includes your primary Heart Failure Specialized Cardiologist (physician), Advanced Practice Providers (APPs- Physician Assistants and Nurse Practitioners), and Pharmacist who all work together to provide you with the care you need, when you need it.   You may see any of the following providers on your designated Care Team at your next follow up:  Dr. Arvilla Meres Dr. Marca Ancona Dr. Marcos Eke, NP Robbie Lis, [place] [facility] [place], [place] Brynda Peon, NP Karle Plumber, PharmD   Please be sure to bring in all your medications bottles to every appointment.   Need to Contact us:  If you have any questions or concerns before your next appointment please send [place] a message through [place] or call our office at [phone].     TO LEAVE A MESSAGE FOR THE NURSE SELECT OPTION 2, PLEASE LEAVE A MESSAGE INCLUDING: YOUR NAME DATE OF BIRTH CALL BACK NUMBER REASON FOR CALL**this is important as we prioritize the call backs  YOU WILL RECEIVE A CALL BACK THE SAME DAY AS LONG AS YOU CALL BEFORE 4:00 PM

## 2023-07-14 NOTE — Addendum Note (Signed)
Encounter addended by: Baird Cancer, RN on: 07/14/2023 9:12 AM  Actions taken: Charge Capture section accepted

## 2023-07-14 NOTE — Addendum Note (Signed)
Encounter addended by: Jacklynn Ganong, FNP on: 07/14/2023 9:07 AM  Actions taken: Clinical Note Signed

## 2023-07-15 NOTE — Addendum Note (Signed)
Encounter addended by: Jacklynn Ganong, FNP on: 07/15/2023 12:36 PM  Actions taken: Alternative orders not taken and original order placed, Order list changed

## 2023-07-24 ENCOUNTER — Other Ambulatory Visit (HOSPITAL_COMMUNITY): Payer: Self-pay | Admitting: Cardiology

## 2023-07-31 ENCOUNTER — Inpatient Hospital Stay (HOSPITAL_COMMUNITY): Admission: RE | Admit: 2023-07-31 | Payer: Commercial Managed Care - HMO | Source: Ambulatory Visit

## 2023-08-02 ENCOUNTER — Other Ambulatory Visit (HOSPITAL_COMMUNITY): Payer: Self-pay | Admitting: Cardiology

## 2023-08-03 ENCOUNTER — Other Ambulatory Visit (HOSPITAL_COMMUNITY): Payer: Self-pay | Admitting: Cardiology

## 2023-08-04 ENCOUNTER — Other Ambulatory Visit (HOSPITAL_COMMUNITY): Payer: Self-pay

## 2023-08-04 MED ORDER — FARXIGA 10 MG PO TABS: 10.0000 mg | ORAL_TABLET | Freq: Every day | ORAL | 3 refills | Status: DC

## 2023-08-24 ENCOUNTER — Other Ambulatory Visit (HOSPITAL_COMMUNITY): Payer: Self-pay

## 2023-08-25 ENCOUNTER — Telehealth (HOSPITAL_COMMUNITY): Payer: Self-pay

## 2023-08-25 ENCOUNTER — Other Ambulatory Visit (HOSPITAL_COMMUNITY): Payer: Self-pay

## 2023-08-25 NOTE — Telephone Encounter (Signed)
Advanced Heart Failure Patient Advocate Encounter  Received notification that prior authorization is required for Vascepa. Review of pt chart shows that Vascepa was discontinued 07/14/23 due to elevated blood sugar levels.   Prior auth not renewed at this time, as pt has changed to Lovaza.  Burnell Blanks, CPhT Rx Patient Advocate Phone: [phone]

## 2023-08-29 ENCOUNTER — Other Ambulatory Visit (HOSPITAL_COMMUNITY): Payer: Self-pay | Admitting: Cardiology

## 2023-09-29 ENCOUNTER — Other Ambulatory Visit (HOSPITAL_COMMUNITY): Payer: Self-pay | Admitting: Cardiology

## 2023-10-26 ENCOUNTER — Other Ambulatory Visit (HOSPITAL_COMMUNITY): Payer: Self-pay | Admitting: Cardiology

## 2023-11-26 ENCOUNTER — Other Ambulatory Visit (HOSPITAL_COMMUNITY): Payer: Self-pay | Admitting: Cardiology

## 2023-12-07 ENCOUNTER — Other Ambulatory Visit (HOSPITAL_COMMUNITY): Payer: Self-pay

## 2023-12-07 ENCOUNTER — Telehealth (HOSPITAL_COMMUNITY): Payer: Self-pay | Admitting: Pharmacy Technician

## 2023-12-07 NOTE — Telephone Encounter (Signed)
Advanced Heart Failure Patient Advocate Encounter  Patient left message stating that his Sherryll Burger co-pay card is no longer working. When I run a test claim, it is working fine. Called and left the patient a message. Emailed patient co-pay card information in case pharmacy has deleted it from their system.  Archer Asa, CPhT

## 2024-01-01 ENCOUNTER — Other Ambulatory Visit (HOSPITAL_COMMUNITY): Payer: Self-pay | Admitting: Cardiology

## 2024-01-15 ENCOUNTER — Encounter (HOSPITAL_COMMUNITY): Payer: Self-pay | Admitting: Cardiology

## 2024-01-15 ENCOUNTER — Ambulatory Visit (HOSPITAL_BASED_OUTPATIENT_CLINIC_OR_DEPARTMENT_OTHER)
Admission: RE | Admit: 2024-01-15 | Discharge: 2024-01-15 | Disposition: A | Discharge status: Home / Self Care | Payer: Commercial Managed Care - HMO | Source: Ambulatory Visit | Attending: Cardiology | Admitting: Cardiology

## 2024-01-15 ENCOUNTER — Ambulatory Visit (HOSPITAL_COMMUNITY)
Admission: RE | Admit: 2024-01-15 | Discharge: 2024-01-15 | Disposition: A | Discharge status: Home / Self Care | Payer: Commercial Managed Care - HMO | Source: Ambulatory Visit | Attending: Family Medicine | Admitting: Family Medicine

## 2024-01-15 VITALS — BP 94/58 | HR 68 | Wt 224.6 lb

## 2024-01-15 DIAGNOSIS — Q2381 Bicuspid aortic valve: Secondary | ICD-10-CM | POA: Diagnosis not present

## 2024-01-15 DIAGNOSIS — J45909 Unspecified asthma, uncomplicated: Secondary | ICD-10-CM | POA: Diagnosis not present

## 2024-01-15 DIAGNOSIS — I5022 Chronic systolic (congestive) heart failure: Secondary | ICD-10-CM | POA: Insufficient documentation

## 2024-01-15 DIAGNOSIS — I110 Hypertensive heart disease with heart failure: Secondary | ICD-10-CM | POA: Insufficient documentation

## 2024-01-15 DIAGNOSIS — Z006 Encounter for examination for normal comparison and control in clinical research program: Secondary | ICD-10-CM

## 2024-01-15 DIAGNOSIS — E119 Type 2 diabetes mellitus without complications: Secondary | ICD-10-CM | POA: Diagnosis not present

## 2024-01-15 DIAGNOSIS — G4733 Obstructive sleep apnea (adult) (pediatric): Secondary | ICD-10-CM | POA: Insufficient documentation

## 2024-01-15 DIAGNOSIS — E785 Hyperlipidemia, unspecified: Secondary | ICD-10-CM | POA: Insufficient documentation

## 2024-01-15 DIAGNOSIS — Z8249 Family history of ischemic heart disease and other diseases of the circulatory system: Secondary | ICD-10-CM | POA: Insufficient documentation

## 2024-01-15 DIAGNOSIS — Z7984 Long term (current) use of oral hypoglycemic drugs: Secondary | ICD-10-CM | POA: Insufficient documentation

## 2024-01-15 DIAGNOSIS — Z7982 Long term (current) use of aspirin: Secondary | ICD-10-CM | POA: Diagnosis not present

## 2024-01-15 DIAGNOSIS — I428 Other cardiomyopathies: Secondary | ICD-10-CM | POA: Diagnosis not present

## 2024-01-15 DIAGNOSIS — Z79899 Other long term (current) drug therapy: Secondary | ICD-10-CM | POA: Insufficient documentation

## 2024-01-15 DIAGNOSIS — I351 Nonrheumatic aortic (valve) insufficiency: Secondary | ICD-10-CM | POA: Insufficient documentation

## 2024-01-15 DIAGNOSIS — I2510 Atherosclerotic heart disease of native coronary artery without angina pectoris: Secondary | ICD-10-CM | POA: Insufficient documentation

## 2024-01-15 LAB — BASIC METABOLIC PANEL
Anion gap: 10 (ref 5–15)
BUN: 15 mg/dL (ref 6–20)
CO2: 23 mmol/L (ref 22–32)
Calcium: 9.7 mg/dL (ref 8.9–10.3)
Chloride: 105 mmol/L (ref 98–111)
Creatinine, Ser: 0.86 mg/dL (ref 0.61–1.24)
GFR, Estimated: >60 mL/min (ref >60)
Glucose, Bld: 169 mg/dL — ABNORMAL HIGH (ref 70–99)
Potassium: 4.7 mmol/L (ref 3.5–5.1)
Sodium: 138 mmol/L (ref 135–145)

## 2024-01-15 LAB — HEMOGLOBIN A1C
Hgb A1c MFr Bld: 6.3 % — ABNORMAL HIGH (ref 4.8–5.6)
Mean Plasma Glucose: 134.11 mg/dL

## 2024-01-15 LAB — ECHOCARDIOGRAM COMPLETE
Area-P 1/2: 2.94 cm2
S' Lateral: 4.1 cm
Single Plane A4C EF: 42.5 %

## 2024-01-15 LAB — LIPID PANEL
Cholesterol: 123 mg/dL (ref 0–200)
HDL: 37 mg/dL — ABNORMAL LOW (ref >40)
LDL Cholesterol: 45 mg/dL (ref 0–99)
Total CHOL/HDL Ratio: 3.3 ratio
Triglycerides: 206 mg/dL — ABNORMAL HIGH (ref <150)
VLDL: 41 mg/dL — ABNORMAL HIGH (ref 0–40)

## 2024-01-15 NOTE — Research (Signed)
 SITE: [id number]     Subject # [id number]    Subprotocol: A  Inclusion Criteria  Patients who meet all of the following criteria are eligible for enrollment as study participants:  Yes No  Age > 50 years old X   Eligible to wear Holter Study X    Exclusion Criteria  Patients who meet any of these criteria are not eligible for enrollment as study participants: Yes No  1. Receiving any mechanical (respiratory or circulatory) or renal support therapy at Screening or during Visit #1.  X  2.  Any other conditions that in the opinion of the investigators are likely to prevent compliance with the study protocol or pose a safety concern if the subject participates in the study.  X  3. Poor tolerance, namely susceptible to severe skin allergies from ECG adhesive patch application.  X   Protocol: REV H                                     Residential Zip [address] (First 3 digits ONLY)                                             [facility] Informed Consent   Subject Name: Curtis [name]  Subject met inclusion and exclusion criteria.  The informed consent form, study requirements and expectations were reviewed with the subject. Subject had opportunity to read consent and questions and concerns were addressed prior to the signing of the consent form.  The subject verbalized understanding of the trial requirements.  The subject agreed to participate in the [facility] EF ACT trial and signed the informed consent at 12:08 on 15-Jan-2024.  The informed consent was obtained prior to performance of any protocol-specific procedures for the subject.  A copy of the signed informed consent was given to the subject and a copy was placed in the subject's medical record.   Dyanne Iha          Current Outpatient Medications:    albuterol (PROVENTIL HFA;VENTOLIN HFA) 108 (90 Base) MCG/ACT inhaler, Inhale 2 puffs into the lungs every 6 (six) hours as needed for wheezing or shortness of breath., Disp: , Rfl:    aspirin 81  MG chewable tablet, Chew 1 tablet (81 mg total) by mouth daily., Disp: 30 tablet, Rfl: 6   atorvastatin (LIPITOR) 80 MG tablet, Take 1 tablet (80 mg total) by mouth daily at 6 PM., Disp: 30 tablet, Rfl: 6   carvedilol (COREG) 12.5 MG tablet, Take 1 tablet (12.5 mg total) by mouth 2 (two) times daily with a meal., Disp: 60 tablet, Rfl: 11   Cholecalciferol (VITAMIN D3) 5000 units CAPS, Take 5,000 Units by mouth daily., Disp: , Rfl:    Coenzyme Q10 (CO Q 10 PO), Take 50 mg by mouth daily in the afternoon., Disp: , Rfl:    FARXIGA 10 MG TABS tablet, Take 1 tablet (10 mg total) by mouth daily., Disp: 90 tablet, Rfl: 3   fluticasone (FLONASE) 50 MCG/ACT nasal spray, Place 1 spray into both nostrils daily., Disp: , Rfl:    furosemide (LASIX) 20 MG tablet, Take 1 tablet (20 mg total) by mouth every other day., Disp: 45 tablet, Rfl: 3   metFORMIN (GLUCOPHAGE-XR) 500 MG 24 hr tablet, Take 1,000 mg by mouth 2 (  two) times daily., Disp: , Rfl:    Multiple Vitamin (MULTIVITAMIN) tablet, Take 1 tablet by mouth daily., Disp: , Rfl:    omega-3 acid ethyl esters (LOVAZA) 1 g capsule, Take 1 g by mouth 2 (two) times daily., Disp: , Rfl:    sacubitril-valsartan (ENTRESTO) 97-103 MG, Take 1 tablet by mouth 2 (two) times daily., Disp: 60 tablet, Rfl: 0   sertraline (ZOLOFT) 100 MG tablet, Take 100 mg by mouth daily., Disp: , Rfl:    sildenafil (VIAGRA) 50 MG tablet, Take 1 tablet (50 mg total) by mouth daily as needed for erectile dysfunction., Disp: 10 tablet, Rfl: 3   spironolactone (ALDACTONE) 25 MG tablet, Take 1 tablet (25 mg total) by mouth daily., Disp: 30 tablet, Rfl: 11   vitamin k 100 MCG tablet, Take 100 mcg by mouth daily., Disp: , Rfl:

## 2024-01-15 NOTE — Progress Notes (Signed)
  Echocardiogram 2D Echocardiogram has been performed.  Delcie Roch 01/15/2024, 12:14 PM

## 2024-01-15 NOTE — Patient Instructions (Signed)
 There has been no changes to your medications.  Labs done today, your results will be available in MyChart, we will contact you for abnormal readings.  Repeat blood work in 3 months.  Your provider has ordered an MRA. You will be called to have this test arranged.  Your physician recommends that you schedule a follow-up appointment in: 6 months.  If you have any questions or concerns before your next appointment please send [place] a message through [place] or call our office at [phone].    TO LEAVE A MESSAGE FOR THE NURSE SELECT OPTION 2, PLEASE LEAVE A MESSAGE INCLUDING: YOUR NAME DATE OF BIRTH CALL BACK NUMBER REASON FOR CALL**this is important as we prioritize the call backs  YOU WILL RECEIVE A CALL BACK THE SAME DAY AS LONG AS YOU CALL BEFORE 4:00 PM  At the [facility], you and your health needs are our priority. As part of our continuing mission to provide you with exceptional heart care, we have created designated Provider Care Teams. These Care Teams include your primary Cardiologist (physician) and Advanced Practice Providers (APPs- Physician Assistants and Nurse Practitioners) who all work together to provide you with the care you need, when you need it.   You may see any of the following providers on your designated Care Team at your next follow up: Dr Arvilla Meres Dr Marca Ancona Dr. Dorthula Nettles Dr. Clearnce Hasten Amy Filbert Schilder, NP Robbie Lis, [place] [facility] [place], [place] Brynda Peon, NP [place] Lee, NP Clarisa Kindred, NP Karle Plumber, PharmD Enos Fling, PharmD   Please be sure to bring in all your medications bottles to every appointment.    Thank you for choosing [facility]

## 2024-01-17 NOTE — Progress Notes (Signed)
 PCP: Dr Uvaldo Rising HF Cardiologist: Dr Shirlee Latch   Chief complaint: CHF  HPI: Curtis [name] is a 48 y.o. male with a history of HTN, asthma, HL, DM, and chronic systolic HF.  He was admitted in 3/19 with dyspnea and volume overload and diagnosed with systolic CHF and cardiogenic shock.  Coronary angiography showed extensive but not critical CAD, did not explain extent of cardiomyopathy.  Cardiac MRI showed EF 21% with patchy mid-wall LGE pattern concerning for myocarditis. He was on short term milrinone but able to wean off. Had SVT/PVCs on milrinone but resolved once milrinone was weaned off. Discharge weight 219 pounds.  He was also noted to have a bicuspid aortic valve with moderate aortic insufficiency.   Echo in 6/19 showed EF up to 35-40%.  Echo in 12/19 showed EF 45% with bicuspid aortic valve and moderate AI. Echo in 8/21 with EF 45-50%, mild LV dilation, normal RV, bicuspid aortic valve with moderate aortic insufficiency with 4.2 cm ascending aorta.  Echo in 11/22 showed EF 40-45%, RV normal, bicuspid aortic valve with moderate AI, 4.0 cm aortic root.   Echo was done today and reviewed, EF 40-45%, normal LV size, bicuspid aortic valve with no AS and moderate AI.    Today he returns for HF follow up. Weight up 2 lbs.  He stopped Vascepa as it was increasing his blood glucose.  He has OSA but has a hard time using CPAP.  He has some generalized fatigue, but not exertional dyspnea or chest pain.  No lightheadedness.  No palpitations.  He is the Financial planner at [employer].    Labs (5/19): K 4.4, creatinine 0.65  Labs (6/19): K 3.8, creatinine 0.69, LDL 16, TGs 300, HDL 33 Labs (8/19): K 3.8, creatinine 0.68, digoxin 0.7, LDL 28, HDL 30 Labs (12/19): K 4.2, creatinine 0.91 Labs (8/21): LDL 26, TGs 181 Labs (11/21): K 3.9, creatinine 0.84 Labs (10/22): LDL 45, TGs 196 Labs (2/23): K 4.2, creatinine 0.8 Labs (12/23): LDL 30, TGs 231, Lp(a) 23 Labs (1/24): K 4.2, creatinine 0.94 Labs  (8/24): LDL 42, TGs 169, K 3.9, creatinine 0.96  PMH: 1. 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  - Echo (3/19): EF 15-20% with moderate LV dilation, mildly dilated RV with moderately decreased RV systolic function. The patient also has a bicuspid aortic valve with moderate AI.  The ascending aorta is dilated to 3.9 cm.  - Cardiac MRI (3/19): Severely dilated LV with LVEF 21%, mildly dilated RV with mildly decreased systolic function. Patchy mid-wall LGE: possible viral myocarditis. - LHC/RHC (3/19): Extensive moderate but not critical CAD.  I would not expect this to have caused his EF to fall so markedly. On RHC, filling pressures were elevated with markedly low cardiac output and elevated SVR.  PAPi was low at 1.4 and CVP/PCWP high at 0.62 suggesting significant co-existing RV dysfunction.  - Echo (6/19): EF 35-40%, moderate diastolic dysfunction, moderate AI, bicuspid aortic valve, mildly decreased RV systolic function.  - Echo (12/19): EF 45% with mild LV dilation, mild LVH, moderate diastolic dysfunction, bicuspid aortic valve with moderate AI.  - Echo (8/21): EF 45-50%, mild LV dilation, normal RV, bicuspid aortic valve with moderate aortic insufficiency with 4.2 cm ascending aorta.  - Echo (11/22): EF 40-45%, RV normal, bicuspid aortic valve with moderate AI, 4.0 cm aortic root.  - Echo (2/25): EF 40-45%, normal LV size, bicuspid aortic valve with no AS and moderate AI.   2. Bicuspid aortic valve: Echo in 8/21  showed moderate aortic insufficiency.  3. CAD: LHC (3/19) with 50% pLAD, 50% ostial ramus, 85% mid LCx, totally occluded OM2 with collaterals, 60% mRCA, 50% dRCA.  4. Type II diabetes 5. OSA: Struggles with CPAP.   Review of systems complete and found to be negative unless listed in HPI.   SH:  Social History   Socioeconomic History   Marital status: Single    Spouse name: Not on file   Number of children: Not on file   Years of  education: Not on file   Highest education level: Not on file  Occupational History   Occupation: Curator  Tobacco Use   Smoking status: Never   Smokeless tobacco: Never  Substance and Sexual Activity   Alcohol use: No    Comment: remote occasional use   Drug use: No    Comment: remote use of "weed edibles", more heavy abuse in college   Sexual activity: Never    Partners: Female    Comment: last 2 years ago  Other Topics Concern   Not on file  Social History Narrative   Not on file   Social Drivers of Health   Financial Resource Strain: Not on file  Food Insecurity: Not on file  Transportation Needs: Not on file  Physical Activity: Not on file  Stress: Not on file  Social Connections: Not on file  Intimate Partner Violence: Not on file   FH:  Family History  Problem Relation Age of Onset   Mitral valve prolapse Mother    Supraventricular tachycardia Mother    Breast cancer Mother 36       BRCA2+   CAD Father 18       CABG, stent   Hypertension Father    Prostate cancer Father 72   Breast cancer Maternal Aunt 45   Breast cancer Maternal Aunt 35   CAD Maternal Uncle    Colon cancer Maternal Uncle    Stomach cancer Maternal Uncle    Stomach cancer Maternal Uncle    Esophageal cancer Maternal Uncle    Breast cancer Paternal Aunt    Congestive Heart Failure Paternal Uncle        has pacer, defibrillator   CAD Maternal Grandmother    Heart failure Maternal Grandfather    CAD Paternal Grandfather     Current Outpatient Medications  Medication Sig Dispense Refill   albuterol (PROVENTIL HFA;VENTOLIN HFA) 108 (90 Base) MCG/ACT inhaler Inhale 2 puffs into the lungs every 6 (six) hours as needed for wheezing or shortness of breath.     aspirin 81 MG chewable tablet Chew 1 tablet (81 mg total) by mouth daily. 30 tablet 6   atorvastatin (LIPITOR) 80 MG tablet Take 1 tablet (80 mg total) by mouth daily at 6 PM. 30 tablet 6   carvedilol (COREG) 12.5 MG tablet Take 1  tablet (12.5 mg total) by mouth 2 (two) times daily with a meal. 60 tablet 11   Cholecalciferol (VITAMIN D3) 5000 units CAPS Take 5,000 Units by mouth daily.     Coenzyme Q10 (CO Q 10 PO) Take 50 mg by mouth daily in the afternoon.     FARXIGA 10 MG TABS tablet Take 1 tablet (10 mg total) by mouth daily. 90 tablet 3   fluticasone (FLONASE) 50 MCG/ACT nasal spray Place 1 spray into both nostrils daily.     furosemide (LASIX) 20 MG tablet Take 1 tablet (20 mg total) by mouth every other day. 45 tablet 3   metFORMIN (GLUCOPHAGE-XR)  500 MG 24 hr tablet Take 1,000 mg by mouth 2 (two) times daily.     Multiple Vitamin (MULTIVITAMIN) tablet Take 1 tablet by mouth daily.     omega-3 acid ethyl esters (LOVAZA) 1 g capsule Take 1 g by mouth 2 (two) times daily.     sacubitril-valsartan (ENTRESTO) 97-103 MG Take 1 tablet by mouth 2 (two) times daily. 60 tablet 0   sertraline (ZOLOFT) 100 MG tablet Take 100 mg by mouth daily.     sildenafil (VIAGRA) 50 MG tablet Take 1 tablet (50 mg total) by mouth daily as needed for erectile dysfunction. 10 tablet 3   spironolactone (ALDACTONE) 25 MG tablet Take 1 tablet (25 mg total) by mouth daily. 30 tablet 11   vitamin k 100 MCG tablet Take 100 mcg by mouth daily.     No current facility-administered medications for this encounter.   BP (!) 94/58   Pulse 68   Wt 101.9 kg (224 lb 9.6 oz)   SpO2 97%   BMI 27.34 kg/m   Wt Readings from Last 3 Encounters:  01/15/24 101.9 kg (224 lb 9.6 oz)  07/14/23 101 kg (222 lb 9.6 oz)  08/19/22 102.3 kg (225 lb 9.6 oz)   PHYSICAL EXAM: General: NAD Neck: No JVD, no thyromegaly or thyroid nodule.  Lungs: Clear to auscultation bilaterally with normal respiratory effort. CV: Nondisplaced PMI.  Heart regular S1/S2, no S3/S4, no murmur.  No peripheral edema.  No carotid bruit.  Normal pedal pulses.  Abdomen: Soft, nontender, no hepatosplenomegaly, no distention.  Skin: Intact without lesions or rashes.  Neurologic: Alert and  oriented x 3.  Psych: Normal affect. Extremities: No clubbing or cyanosis.  HEENT: Normal.   ASSESSMENT & PLAN: 1. Chronic systolic CHF: Primarily nonischemic cardiomyopathy, possibly viral myocarditis based on cardiac MRI.  He also has extensive but not critical CAD, cardiomyopathy is well out of proportion to CAD.  Echo in 6/19 showed EF up to 35-40% and echo in 12/19 showed EF 45%. Echo in 8/21 with stable EF 45-50%. Echo in 11/22 with EF 40-45%.  Echo today was reviewed and showed  EF 40-45%, normal LV size, bicuspid aortic valve with no AS and moderate AI.  NYHA class I-II, not volume overloaded on exam.  - EF remains out of ICD range.  - Continue Coreg 12.5 mg bid.  - Continue Entresto 97/103 bid.  - Continue spironolactone 25 mg daily. BMET/BNP today.  - Continue dapagliflozin 10 mg daily.  - Continue Lasix 20 mg every other day.  2. CAD: LHC 3/19 showed significant but not critical 3-vessel coronary artery disease. No chest pain.  - Continue ASA 81 daily.  - Continue statin + Lovaza, repeat lipids today. 3. Bicuspid Aortic Valve Disorder: Echo in 8/21 with moderate AI and bicuspid aortic valve.  Echo in 11/22 with bicuspid aortic valve and moderate AI, 4 cm aortic root. Echo in 2/25 with moderate AI and no AS.  - I will order MRA chest for full assessment of thoracic aorta.  4. DM: Continue dapagliflozin.   Follow up in 6 months with APP.  Will need repeat BMET in 3 months.   I spent 31 minutes reviewing records, interviewing/examining patient, and managing orders.   Marca Ancona, MD 01/17/2024

## 2024-01-29 ENCOUNTER — Telehealth (HOSPITAL_COMMUNITY): Payer: Self-pay | Admitting: Surgery

## 2024-01-29 ENCOUNTER — Ambulatory Visit (HOSPITAL_COMMUNITY): Admission: RE | Admit: 2024-01-29 | Source: Ambulatory Visit

## 2024-01-29 ENCOUNTER — Encounter (HOSPITAL_COMMUNITY): Payer: Self-pay

## 2024-01-29 ENCOUNTER — Ambulatory Visit (HOSPITAL_COMMUNITY)
Admission: RE | Admit: 2024-01-29 | Discharge: 2024-01-29 | Disposition: A | Discharge status: Home / Self Care | Source: Ambulatory Visit | Attending: Cardiology | Admitting: Cardiology

## 2024-01-29 DIAGNOSIS — I5022 Chronic systolic (congestive) heart failure: Secondary | ICD-10-CM

## 2024-01-29 NOTE — Telephone Encounter (Signed)
 Patient's procedure scheduled for today was unable to be approved through insurance.  I attempted to call patient at phone numbers listed on file in chart with no success.  Unfortunately he had arrived for the MRI and I was unable to cancel the appointment. I did reach out to Radiology registration and caught him.  I was then able to speak to him before he was taken back to begin the study.  I explained that we will reschedule and continue to attempt to acquire authorization.  Patient was understanding.

## 2024-02-01 NOTE — Addendum Note (Signed)
 Encounter addended by: Howell Rucks, RDCS on: 02/01/2024 7:42 AM  Actions taken: Imaging Exam ended

## 2024-02-02 ENCOUNTER — Telehealth (HOSPITAL_COMMUNITY): Payer: Self-pay

## 2024-02-02 NOTE — Telephone Encounter (Signed)
 Advanced Heart Failure Patient Advocate Encounter  Patient contacted office regarding Marcelline Deist copay savings card. Pts 2024 copay card has expired. I have obtained a new card for 2025 and provided the billing information to [facility].  Informed patient via voicemail.  Burnell Blanks, CPhT Rx Patient Advocate Phone: [phone]

## 2024-02-08 ENCOUNTER — Other Ambulatory Visit (HOSPITAL_COMMUNITY): Payer: Self-pay | Admitting: Cardiology

## 2024-03-22 ENCOUNTER — Ambulatory Visit (HOSPITAL_COMMUNITY)
Admission: RE | Admit: 2024-03-22 | Discharge: 2024-03-22 | Disposition: A | Discharge status: Home / Self Care | Payer: Commercial Managed Care - HMO | Source: Ambulatory Visit | Attending: Internal Medicine | Admitting: Internal Medicine

## 2024-03-22 DIAGNOSIS — I5022 Chronic systolic (congestive) heart failure: Secondary | ICD-10-CM | POA: Insufficient documentation

## 2024-03-22 LAB — BASIC METABOLIC PANEL WITH GFR
Anion gap: 10 (ref 5–15)
BUN: 15 mg/dL (ref 6–20)
CO2: 23 mmol/L (ref 22–32)
Calcium: 9 mg/dL (ref 8.9–10.3)
Chloride: 104 mmol/L (ref 98–111)
Creatinine, Ser: 0.84 mg/dL (ref 0.61–1.24)
GFR, Estimated: >60 mL/min (ref >60)
Glucose, Bld: 196 mg/dL — ABNORMAL HIGH (ref 70–99)
Potassium: 3.9 mmol/L (ref 3.5–5.1)
Sodium: 137 mmol/L (ref 135–145)

## 2024-06-20 ENCOUNTER — Telehealth (HOSPITAL_COMMUNITY): Payer: Self-pay

## 2024-06-20 NOTE — Progress Notes (Signed)
 ADVANCED HF CLINIC NOTE PCP: Aisha Harvey, MD HF Cardiologist: Dr. Rolan   HPI: Curtis [name] is a 48 y.o. male with a history of HTN, asthma, HL, DM, and chronic systolic HF.  He was admitted in 3/19 with dyspnea and volume overload and diagnosed with systolic CHF and cardiogenic shock.  Coronary angiography showed extensive but not critical CAD, did not explain extent of cardiomyopathy.  Cardiac MRI showed EF 21% with patchy mid-wall LGE pattern concerning for myocarditis. He was on short term milrinone  but able to wean off. Had SVT/PVCs on milrinone  but resolved once milrinone  was weaned off. Discharge weight 219 pounds. He was also noted to have a bicuspid aortic valve with moderate aortic insufficiency.   Echo in 6/19 showed EF up to 35-40%.  Echo in 12/19 showed EF 45% with bicuspid aortic valve and moderate AI. Echo in 8/21 with EF 45-50%, mild LV dilation, normal RV, bicuspid aortic valve with moderate aortic insufficiency with 4.2 cm ascending aorta.  Echo in 11/22 showed EF 40-45%, RV normal, bicuspid aortic valve with moderate AI, 4.0 cm aortic root.   Echo 2/25 EF 40-45%, normal LV size, bicuspid aortic valve with no AS and moderate AI.    Today he returns for HF follow up. Overall feeling fine. Occasional positional dizziness, no falls. Denies palpitations, abnormal bleeding, CP, dizziness, edema, or PND/Orthopnea. Weight at home 222 pounds. Taking all medications. 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   ECG (personally reviewed): NSR 64 bpm  Labs (1/24): K 4.2, creatinine 0.94 Labs (8/24): LDL 42, TGs 169, K 3.9, creatinine 0.96 Labs (2/25): LDL 45 Labs (5/25): K 3.9, creatinine 0.84   PMH: 1. 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  - Echo (3/19): EF 15-20% with moderate LV dilation, mildly dilated RV with moderately decreased RV systolic function. The patient also has a bicuspid aortic valve with  moderate AI.  The ascending aorta is dilated to 3.9 cm.  - Cardiac MRI (3/19): Severely dilated LV with LVEF 21%, mildly dilated RV with mildly decreased systolic function. Patchy mid-wall LGE: possible viral myocarditis. - LHC/RHC (3/19): Extensive moderate but not critical CAD.  I would not expect this to have caused his EF to fall so markedly. On RHC, filling pressures were elevated with markedly low cardiac output and elevated SVR.  PAPi was low at 1.4 and CVP/PCWP high at 0.62 suggesting significant co-existing RV dysfunction.  - Echo (6/19): EF 35-40%, moderate diastolic dysfunction, moderate AI, bicuspid aortic valve, mildly decreased RV systolic function.  - Echo (12/19): EF 45% with mild LV dilation, mild LVH, moderate diastolic dysfunction, bicuspid aortic valve with moderate AI.  - Echo (8/21): EF 45-50%, mild LV dilation, normal RV, bicuspid aortic valve with moderate aortic insufficiency with 4.2 cm ascending aorta.  - Echo (11/22): EF 40-45%, RV normal, bicuspid aortic valve with moderate AI, 4.0 cm aortic root.  - Echo (2/25): EF 40-45%, normal LV size, bicuspid aortic valve with no AS and moderate AI.   2. Bicuspid aortic valve: Echo in 8/21 showed moderate aortic insufficiency.  3. CAD: LHC (3/19) with 50% pLAD, 50% ostial ramus, 85% mid LCx, totally occluded OM2 with collaterals, 60% mRCA, 50% dRCA.  4. Type II diabetes 5. OSA: Struggles with CPAP.   Review of systems complete and found to be negative unless listed in HPI.   SH:  Social History   Socioeconomic History   Marital status: Single    Spouse  name: Not on file   Number of children: Not on file   Years of education: Not on file   Highest education level: Not on file  Occupational History   Occupation: Curator  Tobacco Use   Smoking status: Never   Smokeless tobacco: Never  Substance and Sexual Activity   Alcohol use: No    Comment: remote occasional use   Drug use: No    Comment: remote use of weed  edibles, more heavy abuse in college   Sexual activity: Never    Partners: Female    Comment: last 2 years ago  Other Topics Concern   Not on file  Social History Narrative   Not on file   Social Drivers of Health   Financial Resource Strain: Not on file  Food Insecurity: Not on file  Transportation Needs: Not on file  Physical Activity: Not on file  Stress: Not on file  Social Connections: Not on file  Intimate Partner Violence: Not on file   FH:  Family History  Problem Relation Age of Onset   Mitral valve prolapse Mother    Supraventricular tachycardia Mother    Breast cancer Mother 10       BRCA2+   CAD Father 75       CABG, stent   Hypertension Father    Prostate cancer Father 18   Breast cancer Maternal Aunt 45   Breast cancer Maternal Aunt 35   CAD Maternal Uncle    Colon cancer Maternal Uncle    Stomach cancer Maternal Uncle    Stomach cancer Maternal Uncle    Esophageal cancer Maternal Uncle    Breast cancer Paternal Aunt    Congestive Heart Failure Paternal Uncle        has pacer, defibrillator   CAD Maternal Grandmother    Heart failure Maternal Grandfather    CAD Paternal Grandfather    Current Outpatient Medications  Medication Sig Dispense Refill   albuterol  (PROVENTIL  HFA;VENTOLIN  HFA) 108 (90 Base) MCG/ACT inhaler Inhale 2 puffs into the lungs every 6 (six) hours as needed for wheezing or shortness of breath.     aspirin  81 MG chewable tablet Chew 1 tablet (81 mg total) by mouth daily. 30 tablet 6   atorvastatin  (LIPITOR ) 80 MG tablet Take 1 tablet (80 mg total) by mouth daily at 6 PM. 30 tablet 6   carvedilol  (COREG ) 12.5 MG tablet Take 1 tablet (12.5 mg total) by mouth 2 (two) times daily with a meal. 60 tablet 11   Cholecalciferol (VITAMIN D3) 5000 units CAPS Take 5,000 Units by mouth daily.     Coenzyme Q10 (CO Q 10 PO) Take 50 mg by mouth daily in the afternoon.     FARXIGA  10 MG TABS tablet Take 1 tablet (10 mg total) by mouth daily. 90  tablet 3   fluticasone  (FLONASE ) 50 MCG/ACT nasal spray Place 1 spray into both nostrils daily.     furosemide  (LASIX ) 20 MG tablet Take 1 tablet (20 mg total) by mouth every other day. 45 tablet 3   metFORMIN (GLUCOPHAGE-XR) 500 MG 24 hr tablet Take 1,000 mg by mouth 2 (two) times daily.     Multiple Vitamin (MULTIVITAMIN) tablet Take 1 tablet by mouth daily.     omega-3 acid ethyl esters (LOVAZA ) 1 g capsule Take 1 g by mouth 2 (two) times daily.     sacubitril -valsartan  (ENTRESTO ) 97-103 MG Take 1 tablet by mouth twice daily 60 tablet 6   sertraline  (ZOLOFT ) 100  MG tablet Take 100 mg by mouth daily.     sildenafil  (VIAGRA ) 50 MG tablet Take 1 tablet (50 mg total) by mouth daily as needed for erectile dysfunction. 10 tablet 3   spironolactone  (ALDACTONE ) 25 MG tablet Take 1 tablet (25 mg total) by mouth daily. 30 tablet 11   vitamin k 100 MCG tablet Take 100 mcg by mouth daily.     No current facility-administered medications for this encounter.   BP (!) 110/54   Pulse 70   Ht 6' 4 (1.93 m)   Wt 101.5 kg (223 lb 12.8 oz)   SpO2 98%   BMI 27.24 kg/m   Wt Readings from Last 3 Encounters:  06/21/24 101.5 kg (223 lb 12.8 oz)  01/15/24 101.9 kg (224 lb 9.6 oz)  07/14/23 101 kg (222 lb 9.6 oz)   PHYSICAL EXAM: General:  NAD. No resp difficulty, walked into clinic HEENT: Normal Neck: Supple. No JVD. Cor: Regular rate & rhythm. No rubs, gallops or murmurs. Lungs: Clear Abdomen: Soft, nontender, nondistended.  Extremities: No cyanosis, clubbing, rash, edema Neuro: Alert & oriented x 3, moves all 4 extremities w/o difficulty. Affect pleasant.  ASSESSMENT & PLAN: 1. Chronic systolic CHF: Primarily nonischemic cardiomyopathy, possibly viral myocarditis based on cardiac MRI.  He also has extensive but not critical CAD, cardiomyopathy is well out of proportion to CAD.  Echo in 6/19 showed EF up to 35-40% and echo in 12/19 showed EF 45%. Echo in 8/21 with stable EF 45-50%. Echo in 11/22  with EF 40-45%.  Echo 2/25 showed  EF 40-45%, normal LV size, bicuspid aortic valve with no AS and moderate AI.  NYHA class I-II, not volume overloaded on exam.  - EF remains out of ICD range.  - Continue Coreg  12.5 mg bid.  - Continue Entresto  97/103 bid.  - Continue spironolactone  25 mg daily. BMET/BNP today. - Continue dapagliflozin  10 mg daily.  - Continue Lasix  20 mg every other day.  - Update echo next visit. 2. CAD: LHC 3/19 showed significant but not critical 3-vessel coronary artery disease. No chest pain.  - Continue ASA 81 daily.  - Continue statin + Lovaza , LDL 45 (2/25) 3. Bicuspid Aortic Valve Disorder: Echo in 8/21 with moderate AI and bicuspid aortic valve.  Echo in 11/22 with bicuspid aortic valve and moderate AI, 4 cm aortic root. Echo in 2/25 with moderate AI and no AS.  - will arrange MRA chest for full assessment of thoracic aorta.  4. DM: Continue dapagliflozin .   Follow up in 6 months with Dr. Rolan + echo.  Harlene Gainer, FNP-BC 06/21/24

## 2024-06-20 NOTE — Telephone Encounter (Signed)
 Called to confirm/remind patient of their appointment at the [facility] on 06/21/24\.   Appointment:   [] Confirmed  [x] Left mess   [] No answer/No voice mail  [] VM Full/unable to leave message  [] Phone not in service  And to bring in all medications and/or complete list.

## 2024-06-21 ENCOUNTER — Ambulatory Visit (HOSPITAL_COMMUNITY)
Admission: RE | Admit: 2024-06-21 | Discharge: 2024-06-21 | Disposition: A | Discharge status: Home / Self Care | Payer: Commercial Managed Care - HMO | Source: Ambulatory Visit | Attending: Cardiology | Admitting: Cardiology

## 2024-06-21 ENCOUNTER — Encounter (HOSPITAL_COMMUNITY): Payer: Self-pay

## 2024-06-21 VITALS — BP 110/54 | HR 70 | Ht 76.0 in | Wt 223.8 lb

## 2024-06-21 DIAGNOSIS — I351 Nonrheumatic aortic (valve) insufficiency: Secondary | ICD-10-CM | POA: Diagnosis not present

## 2024-06-21 DIAGNOSIS — Q2381 Bicuspid aortic valve: Secondary | ICD-10-CM | POA: Insufficient documentation

## 2024-06-21 DIAGNOSIS — I110 Hypertensive heart disease with heart failure: Secondary | ICD-10-CM | POA: Diagnosis not present

## 2024-06-21 DIAGNOSIS — J45909 Unspecified asthma, uncomplicated: Secondary | ICD-10-CM | POA: Diagnosis not present

## 2024-06-21 DIAGNOSIS — Z7984 Long term (current) use of oral hypoglycemic drugs: Secondary | ICD-10-CM | POA: Diagnosis not present

## 2024-06-21 DIAGNOSIS — Z79899 Other long term (current) drug therapy: Secondary | ICD-10-CM | POA: Insufficient documentation

## 2024-06-21 DIAGNOSIS — I2510 Atherosclerotic heart disease of native coronary artery without angina pectoris: Secondary | ICD-10-CM | POA: Diagnosis not present

## 2024-06-21 DIAGNOSIS — Z7982 Long term (current) use of aspirin: Secondary | ICD-10-CM | POA: Insufficient documentation

## 2024-06-21 DIAGNOSIS — I5022 Chronic systolic (congestive) heart failure: Secondary | ICD-10-CM | POA: Insufficient documentation

## 2024-06-21 DIAGNOSIS — E119 Type 2 diabetes mellitus without complications: Secondary | ICD-10-CM | POA: Insufficient documentation

## 2024-06-21 DIAGNOSIS — I428 Other cardiomyopathies: Secondary | ICD-10-CM | POA: Insufficient documentation

## 2024-06-21 LAB — BASIC METABOLIC PANEL WITH GFR
Anion gap: 10 (ref 5–15)
BUN: 18 mg/dL (ref 6–20)
CO2: 21 mmol/L — ABNORMAL LOW (ref 22–32)
Calcium: 9.2 mg/dL (ref 8.9–10.3)
Chloride: 106 mmol/L (ref 98–111)
Creatinine, Ser: 0.78 mg/dL (ref 0.61–1.24)
GFR, Estimated: >60 mL/min (ref >60)
Glucose, Bld: 185 mg/dL — ABNORMAL HIGH (ref 70–99)
Potassium: 4 mmol/L (ref 3.5–5.1)
Sodium: 137 mmol/L (ref 135–145)

## 2024-06-21 LAB — BRAIN NATRIURETIC PEPTIDE: B Natriuretic Peptide: 164.9 pg/mL — ABNORMAL HIGH (ref 0.0–100.0)

## 2024-06-21 MED ORDER — FUROSEMIDE 20 MG PO TABS: 20.0000 mg | ORAL_TABLET | ORAL | 3 refills | Status: AC

## 2024-06-21 MED ORDER — SPIRONOLACTONE 25 MG PO TABS: 25.0000 mg | ORAL_TABLET | Freq: Every day | ORAL | 3 refills | Status: AC

## 2024-06-21 MED ORDER — FARXIGA 10 MG PO TABS: 10.0000 mg | ORAL_TABLET | Freq: Every day | ORAL | 3 refills | Status: AC

## 2024-06-21 MED ORDER — SACUBITRIL-VALSARTAN 97-103 MG PO TABS: 1.0000 | ORAL_TABLET | Freq: Two times a day (BID) | ORAL | 3 refills | Status: AC

## 2024-06-21 MED ORDER — ATORVASTATIN CALCIUM 80 MG PO TABS: 80.0000 mg | ORAL_TABLET | Freq: Every day | ORAL | 3 refills | Status: AC

## 2024-06-21 NOTE — Patient Instructions (Signed)
 There has been no changes to your medications.  Labs done today, your results will be available in MyChart, we will contact you for abnormal readings.  Your physician has requested that you have an echocardiogram. Echocardiography is a painless test that uses sound waves to create images of your heart. It provides your doctor with information about the size and shape of your heart and how well your heart's chambers and valves are working. This procedure takes approximately one hour. There are no restrictions for this procedure. Please do NOT wear cologne, perfume, aftershave, or lotions (deodorant is allowed). Please arrive 15 minutes prior to your appointment time.  Please note: We ask at that you not bring children with you during ultrasound (echo/ vascular) testing. Due to room size and safety concerns, children are not allowed in the ultrasound rooms during exams. Our front office staff cannot provide observation of children in our lobby area while testing is being conducted. An adult accompanying a patient to their appointment will only be allowed in the ultrasound room at the discretion of the ultrasound technician under special circumstances. We apologize for any inconvenience.  Your physician recommends that you schedule a follow-up appointment in: 6 months with an echocardiogram ( February 2026) ** PLEASE CALL THE OFFICE IN NOVEMBER TO ARRANGE YOUR FOLLOW UP APPOINTMENT.**  If you have any questions or concerns before your next appointment please send us  a message through [place] or call our office at [phone].    TO LEAVE A MESSAGE FOR THE NURSE SELECT OPTION 2, PLEASE LEAVE A MESSAGE INCLUDING: YOUR NAME DATE OF BIRTH CALL BACK NUMBER REASON FOR CALL**this is important as we prioritize the call backs  YOU WILL RECEIVE A CALL BACK THE SAME DAY AS LONG AS YOU CALL BEFORE 4:00 PM  At the [facility], you and your health needs are our priority. As part of our  continuing mission to provide you with exceptional heart care, we have created designated Provider Care Teams. These Care Teams include your primary Cardiologist (physician) and Advanced Practice Providers (APPs- Physician Assistants and Nurse Practitioners) who all work together to provide you with the care you need, when you need it.   You may see any of the following providers on your designated Care Team at your next follow up: Dr Toribio Fuel Dr Ezra Shuck Dr. Ria Commander Dr. Morene Brownie Amy Lenetta, NP Caffie Shed, [place] [facility] [place], [place] Beckey Coe, NP [place] Lee, NP Ellouise Class, NP Tinnie Redman, PharmD Jaun Bash, PharmD   Please be sure to bring in all your medications bottles to every appointment.    Thank you for choosing [facility]

## 2024-06-21 NOTE — Addendum Note (Signed)
 Encounter addended by: Marcelina Lisa HERO, RN on: 06/21/2024 10:38 AM  Actions taken: Order list changed

## 2024-06-22 ENCOUNTER — Ambulatory Visit (HOSPITAL_COMMUNITY): Payer: Self-pay | Admitting: Family Medicine

## 2024-07-14 ENCOUNTER — Other Ambulatory Visit (HOSPITAL_COMMUNITY): Payer: Self-pay | Admitting: Cardiology

## 2024-09-17 DEATH — deceased
